# Patient Record
Sex: Male | Born: 1965 | Race: White | Hispanic: No | Marital: Married | Smoking: Former smoker
Health system: Southern US, Community
[De-identification: ages and names within clinical notes are randomized; demographics above are authoritative.]

## PROBLEM LIST (undated history)

## (undated) DIAGNOSIS — C73 Malignant neoplasm of thyroid gland: Secondary | ICD-10-CM

## (undated) DIAGNOSIS — Z87442 Personal history of urinary calculi: Secondary | ICD-10-CM

## (undated) DIAGNOSIS — N200 Calculus of kidney: Secondary | ICD-10-CM

## (undated) DIAGNOSIS — E079 Disorder of thyroid, unspecified: Secondary | ICD-10-CM

## (undated) HISTORY — DX: Disorder of thyroid, unspecified: E07.9

## (undated) HISTORY — DX: Calculus of kidney: N20.0

## (undated) HISTORY — DX: Malignant neoplasm of thyroid gland: C73

## (undated) HISTORY — PX: VASECTOMY: SHX75

## (undated) HISTORY — PX: TIBIA FRACTURE SURGERY: SHX806

---

## 2009-12-31 HISTORY — PX: LITHOTRIPSY: SUR834

## 2010-12-31 DIAGNOSIS — Z87442 Personal history of urinary calculi: Secondary | ICD-10-CM

## 2010-12-31 HISTORY — DX: Personal history of urinary calculi: Z87.442

## 2011-03-07 ENCOUNTER — Emergency Department (HOSPITAL_COMMUNITY)
Admission: EM | Admit: 2011-03-07 | Discharge: 2011-03-08 | Disposition: A | Discharge status: Home / Self Care | Payer: BC Managed Care – PPO | Source: Home / Self Care | Attending: Emergency Medicine | Admitting: Emergency Medicine

## 2011-03-07 ENCOUNTER — Emergency Department (HOSPITAL_COMMUNITY): Payer: BC Managed Care – PPO

## 2011-03-07 ENCOUNTER — Encounter (HOSPITAL_COMMUNITY): Payer: Self-pay

## 2011-03-07 DIAGNOSIS — N201 Calculus of ureter: Secondary | ICD-10-CM | POA: Insufficient documentation

## 2011-03-07 DIAGNOSIS — R109 Unspecified abdominal pain: Secondary | ICD-10-CM | POA: Insufficient documentation

## 2011-03-07 LAB — COMPREHENSIVE METABOLIC PANEL
CO2: 25 mEq/L (ref 19–32)
Calcium: 8.7 mg/dL (ref 8.4–10.5)
Creatinine, Ser: 1.05 mg/dL (ref 0.4–1.5)
GFR calc non Af Amer: >60 mL/min (ref >60)
Glucose, Bld: 127 mg/dL — ABNORMAL HIGH (ref 70–99)

## 2011-03-07 LAB — DIFFERENTIAL
Basophils Relative: 0 % (ref 0–1)
Lymphocytes Relative: 10 % — ABNORMAL LOW (ref 12–46)
Monocytes Absolute: 0.7 10*3/uL (ref 0.1–1.0)
Monocytes Relative: 5 % (ref 3–12)
Neutro Abs: 13.5 10*3/uL — ABNORMAL HIGH (ref 1.7–7.7)

## 2011-03-07 LAB — LIPASE, BLOOD: Lipase: 30 U/L (ref 11–59)

## 2011-03-07 LAB — URINE MICROSCOPIC-ADD ON

## 2011-03-07 LAB — URINALYSIS, ROUTINE W REFLEX MICROSCOPIC
Nitrite: NEGATIVE
Specific Gravity, Urine: 1.02 (ref 1.005–1.030)
Urobilinogen, UA: 1 mg/dL (ref 0.0–1.0)

## 2011-03-07 LAB — CBC
HCT: 43.1 % (ref 39.0–52.0)
Hemoglobin: 14.6 g/dL (ref 13.0–17.0)
MCH: 30.3 pg (ref 26.0–34.0)
MCHC: 33.9 g/dL (ref 30.0–36.0)

## 2011-03-07 MED ORDER — IOHEXOL 300 MG/ML  SOLN
100.0000 mL | Freq: Once | INTRAMUSCULAR | Status: AC | PRN
  Administered 2011-03-07: 100 mL via INTRAVENOUS

## 2011-03-08 ENCOUNTER — Ambulatory Visit (HOSPITAL_COMMUNITY)
Admission: RE | Admit: 2011-03-08 | Discharge: 2011-03-08 | Disposition: A | Discharge status: Home / Self Care | Payer: BC Managed Care – PPO | Source: Ambulatory Visit | Attending: Urology | Admitting: Urology

## 2011-03-08 ENCOUNTER — Ambulatory Visit (HOSPITAL_COMMUNITY)
Admission: AD | Admit: 2011-03-08 | Discharge: 2011-03-08 | Disposition: A | Discharge status: Home / Self Care | Payer: BC Managed Care – PPO | Source: Ambulatory Visit | Attending: Urology | Admitting: Urology

## 2011-03-08 DIAGNOSIS — N201 Calculus of ureter: Secondary | ICD-10-CM | POA: Insufficient documentation

## 2013-12-29 ENCOUNTER — Encounter: Payer: Self-pay | Admitting: Endocrinology

## 2013-12-29 ENCOUNTER — Ambulatory Visit (INDEPENDENT_AMBULATORY_CARE_PROVIDER_SITE_OTHER): Payer: BC Managed Care – PPO | Admitting: Endocrinology

## 2013-12-29 VITALS — BP 118/80 | HR 71 | Temp 98.3°F | Resp 12 | Ht 69.0 in | Wt 179.0 lb

## 2013-12-29 DIAGNOSIS — E041 Nontoxic single thyroid nodule: Secondary | ICD-10-CM

## 2013-12-29 NOTE — Progress Notes (Signed)
Patient ID: Cameron Perez, male   DOB: Feb 06, 1966, 47 y.o.   MRN: [id number]   Reason for Appointment: Thyroid nodule, new consultation   History of Present Illness:   The patient's thyroid nodule was first discovered in 12/14 when he had a ultrasound neck screening done at his work Apparently he had this is part of a routine health exam at work. He was seen by his PCP who has referred him here. Thyroid functions were checked and TSH was 1.1 this month.  He thinks he can feel a nodule in his neck and occasionally may have a little difficulty swallowing with needing to clear his throat   Does not feel like he has any choking sensation in her neck or pressure in any position or when lying down.     Medication List       This list is accurate as of: 12/29/13  8:53 AM.  Always use your most recent med list.               chlorhexidine 0.12 % solution  Commonly known as:  PERIDEX     CIALIS 10 MG tablet  Generic drug:  tadalafil        Allergies: No Known Allergies  Past Medical History  Diagnosis Date  . Thyroid disease     Past Surgical History  Procedure Laterality Date  . Lithotripsy  2011    Family History  Problem Relation Age of Onset  . Cancer Mother   . Cancer Maternal Grandmother     Social History:  reports that he quit smoking about 10 years ago. He has never used smokeless tobacco. His alcohol and drug histories are not on file.   Review of Systems:  CARDIOLOGY: no history of high blood pressure.                ENDOCRINOLOGY:  no history of Diabetes.             Examination:   BP 118/80  Pulse 71  Temp(Src) 98.3 F (36.8 C)  Resp 12  Ht 5\' 9"  (1.753 m)  Wt 179 lb (81.194 kg)  BMI 26.42 kg/m2  SpO2 97%   General Appearance:  averagely built and nourished, well-looking No clubbing or pallor.         Eyes: No abnormal prominence or eyelid swelling.          THYROID: He has a 1.5 cm firm nodule in the mid thyroid lobe on the right  and indistinct nodularity felt below this also. No nodule on the left side NECK: There is no lymphadenopathy .    Neurological: REFLEXES: at biceps are normal.    Assessment/Plan:  Solitary thyroid nodule on the right side based on screening ultrasound exam done Euthyroid by lab tests recently He does need a formal ultrasound to characterize the nodule and the rest of the thyroid Discussed that most likely we will need to do a needle aspiration to evaluate this and how this is done. Given patient handout on thyroid nodules He will be scheduled for this  [facility] 12/29/2013

## 2013-12-31 DIAGNOSIS — C73 Malignant neoplasm of thyroid gland: Secondary | ICD-10-CM

## 2013-12-31 HISTORY — DX: Malignant neoplasm of thyroid gland: C73

## 2014-01-01 ENCOUNTER — Telehealth: Payer: Self-pay | Admitting: *Deleted

## 2014-01-01 ENCOUNTER — Ambulatory Visit
Admission: RE | Admit: 2014-01-01 | Discharge: 2014-01-01 | Disposition: A | Discharge status: Home / Self Care | Payer: BC Managed Care – PPO | Source: Ambulatory Visit | Attending: Endocrinology | Admitting: Endocrinology

## 2014-01-01 DIAGNOSIS — E041 Nontoxic single thyroid nodule: Secondary | ICD-10-CM

## 2014-01-01 NOTE — Telephone Encounter (Signed)
Called pt and advised him that the U/S shows he has a 1.9 cm nodule, will schedule biopsy next week. Pt understood.

## 2014-01-01 NOTE — Telephone Encounter (Signed)
Shows 1.9 cm nodule, will schedule biopsy next week

## 2014-01-01 NOTE — Telephone Encounter (Signed)
Pt called requesting results from thryoid U/S. Please advise.

## 2014-01-03 ENCOUNTER — Other Ambulatory Visit: Payer: Self-pay | Admitting: Endocrinology

## 2014-01-03 DIAGNOSIS — E041 Nontoxic single thyroid nodule: Secondary | ICD-10-CM

## 2014-01-14 ENCOUNTER — Other Ambulatory Visit (HOSPITAL_COMMUNITY)
Admission: RE | Admit: 2014-01-14 | Discharge: 2014-01-14 | Disposition: A | Discharge status: Home / Self Care | Payer: BC Managed Care – PPO | Source: Ambulatory Visit | Attending: Interventional Radiology | Admitting: Interventional Radiology

## 2014-01-14 ENCOUNTER — Ambulatory Visit
Admission: RE | Admit: 2014-01-14 | Discharge: 2014-01-14 | Disposition: A | Discharge status: Home / Self Care | Payer: BC Managed Care – PPO | Source: Ambulatory Visit | Attending: Endocrinology | Admitting: Endocrinology

## 2014-01-14 DIAGNOSIS — E041 Nontoxic single thyroid nodule: Secondary | ICD-10-CM

## 2014-01-20 ENCOUNTER — Ambulatory Visit: Payer: BC Managed Care – PPO | Admitting: Endocrinology

## 2014-01-20 ENCOUNTER — Ambulatory Visit (INDEPENDENT_AMBULATORY_CARE_PROVIDER_SITE_OTHER): Payer: BC Managed Care – PPO | Admitting: Endocrinology

## 2014-01-20 DIAGNOSIS — C73 Malignant neoplasm of thyroid gland: Secondary | ICD-10-CM

## 2014-01-20 NOTE — Progress Notes (Signed)
Patient ID: Cameron Perez, male   DOB: 1966-03-26, 48 y.o.   MRN: [id number]   Reason for Appointment: Discuss biopsy results   History of Present Illness:   He had a thyroid nodule was first discovered in 12/14 when he had a ultrasound neck screening done at his work Formal ultrasound done showed a 1.9 cm right-sided nodule Needle aspiration biopsy was done on 01/14/14 and showed papillary carcinoma.  The patient is here for discussion about the diagnosis and further plans. He also has questions about the diagnosis and treatment     Medication List       This list is accurate as of: 01/20/14  9:01 PM.  Always use your most recent med list.               chlorhexidine 0.12 % solution  Commonly known as:  PERIDEX     CIALIS 10 MG tablet  Generic drug:  tadalafil        Allergies: No Known Allergies  Past Medical History  Diagnosis Date  . Thyroid disease     Past Surgical History  Procedure Laterality Date  . Lithotripsy  2011    Family History  Problem Relation Age of Onset  . Cancer Mother   . Cancer Maternal Grandmother     Social History:  reports that he quit smoking about 10 years ago. He has never used smokeless tobacco. His alcohol and drug histories are not on file.   Review of Systems:  CARDIOLOGY: no history of high blood pressure.                ENDOCRINOLOGY:  no history of Diabetes.            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 Discussed in detail the implications of the diagnosis, prognosis and treatment plan Also explained to him that although most likely since his tumor is less than 2 cm he should be at low risk for recurrence and spread. Further classification of this will be done after he has his surgery and review of pathology as well as presence of any involved lymph nodes. I indicated to him that he will need a total thyroidectomy, details of which will be explained by the surgeon He was  briefly explained the small but possible risks of parathyroid and laryngeal nerve injury during surgery  After surgery most likely he will require replacement dose of 137 mcg daily based on his weight  Also explained  the need for radioactive iodine ablation of remnant which is by recent clinical reveals usually reserved for patients at intermediate or high risk. May also be followed periodically with thyroglobulin levels   He was given the thyroid cancer handout from the [employer]  Surgery referral was done    [facility] 01/20/2014

## 2014-01-28 ENCOUNTER — Ambulatory Visit (INDEPENDENT_AMBULATORY_CARE_PROVIDER_SITE_OTHER): Payer: BC Managed Care – PPO | Admitting: General Surgery

## 2014-01-28 ENCOUNTER — Encounter (INDEPENDENT_AMBULATORY_CARE_PROVIDER_SITE_OTHER): Payer: Self-pay | Admitting: General Surgery

## 2014-01-28 VITALS — BP 110/72 | HR 76 | Temp 97.8°F | Resp 14 | Ht 70.5 in | Wt 182.0 lb

## 2014-01-28 DIAGNOSIS — C73 Malignant neoplasm of thyroid gland: Secondary | ICD-10-CM

## 2014-01-28 NOTE — Progress Notes (Signed)
Subjective:   new diagnosis of thyroid cancer  Patient ID: Cameron Perez, male   DOB: Oct 19, 1966, 48 y.o.   MRN: [id number]  HPI Patient is a very pleasant 48 year old male referred by Dr. Dwyane Dee for a new diagnosis of papillary cancer of the thyroid. The patient is a Government social research officer at [employer]. They recently offered a ultrasound screening for carotid disease which he participated in. This incidentally revealed a 1.9 cm solitary nodule in the right lobe of the thyroid. With this finding the patient was referred to Dr. Dwyane Dee and a fine-needle aspiration was ordered. I reviewed this resolved which showed an adequate specimen and was highly suspicious for papillary carcinoma. The patient had not been able to feel a lump or a pressure in his neck. He had noticed a little raspiness to his voice for a couple of months. He has no previous history of head or neck radiation. No family history of thyroid cancers.  Past Medical History  Diagnosis Date  . Thyroid disease   . Cancer     thryoid   Past Surgical History  Procedure Laterality Date  . Lithotripsy  2011  . Tibia fracture surgery      left leg   Current Outpatient Prescriptions  Medication Sig Dispense Refill  . chlorhexidine (PERIDEX) 0.12 % solution       . CIALIS 10 MG tablet       . clomiPHENE (CLOMID) 50 MG tablet       . Multiple Vitamin (MULTIVITAMIN) tablet Take 1 tablet by mouth daily.      Marland Kitchen OVER THE COUNTER MEDICATION       . thiamine (VITAMIN B-1) 100 MG tablet Take 100 mg by mouth daily.       No current facility-administered medications for this visit.   No Known Allergies History  Substance Use Topics  . Smoking status: Former Smoker    Quit date: 06/30/2003  . Smokeless tobacco: Never Used  . Alcohol Use: 0.6 oz/week    1 Cans of beer per week     Comment: weekly     Review of Systems  Constitutional: Negative.   HENT: Negative.   Respiratory: Negative.   Cardiovascular: Negative.        Objective:   Physical Exam BP 110/72  Pulse 76  Temp(Src) 97.8 F (36.6 C) (Oral)  Resp 14  Ht 5' 10.5" (1.791 m)  Wt 182 lb (82.555 kg)  BMI 25.74 kg/m2 General: Alert, well-developed male, in no distress Skin: Warm and dry without rash or infection. HEENT:with swallowing on the right there is a small palpable thyroid nodule. No other neck masses palpable. Lymph nodes: No cervical, supraclavicular nodes palpable. Lungs: Breath sounds clear and equal without increased work of breathing Cardiovascular: Regular rate and rhythm without murmur. No JVD or edema. Peripheral pulses intact. Abdomen: Nondistended. Soft and nontender. No masses palpable. No organomegaly. No palpable hernias. Extremities: No edema or joint swelling or deformity. No chronic venous stasis changes. Neurologic: Alert and fully oriented. Gait normal.    Assessment:     Recent incidental discovery of 1.9 cm solitary right thyroid nodule with fine-needle aspiration was suspicious for papillary carcinoma. I have recommended proceeding with total thyroidectomy. We discussed the procedure in detail including the indications and nature of the procedure and recovery. We discussed risks including anesthetic complications, bleeding, infection, recurrent laryngeal nerve injury with permanent hoarseness and injury to parathyroid glands with permanent hypocalcemia. We discussed postoperative radioactive iodine therapy and the need for  postoperative thyroid replacement. He was given literature regarding the procedure and all his questions were answered.     Plan:     Schedule for total thyroidectomy with overnight hospitalization

## 2014-02-01 ENCOUNTER — Ambulatory Visit: Payer: BC Managed Care – PPO | Admitting: Endocrinology

## 2014-02-02 ENCOUNTER — Encounter (INDEPENDENT_AMBULATORY_CARE_PROVIDER_SITE_OTHER): Payer: Self-pay

## 2014-02-03 ENCOUNTER — Encounter (HOSPITAL_COMMUNITY): Payer: Self-pay | Admitting: Pharmacy Technician

## 2014-02-04 NOTE — Patient Instructions (Addendum)
Diante A Hudspeth  02/04/2014                           YOUR PROCEDURE IS SCHEDULED ON:  02/09/14               PLEASE REPORT TO SHORT STAY CENTER AT :  12:15 PM               CALL THIS NUMBER IF ANY PROBLEMS THE DAY OF SURGERY :               [phone]                                REMEMBER:   Do not eat food or drink liquids AFTER MIDNIGHT   May have clear liquids UNTIL 6 HOURS BEFORE SURGERY (8:45 AM)     Take these medicines the morning of surgery with A SIP OF WATER: CLOMID   Do not wear jewelry, make-up   Do not wear lotions, powders, or perfumes.   Do not shave legs or underarms 12 hrs. before surgery (men may shave face)  Do not bring valuables to the hospital.  Contacts, dentures or bridgework may not be worn into surgery.  Leave suitcase in the car. After surgery it may be brought to your room.  For patients admitted to the hospital more than one night, checkout time is 11:00 AM                       The day of discharge.   Patients discharged the day of surgery will not be allowed to drive home.              If going home same day of surgery, must have someone stay with you first              24 hrs at home and arrange for some one to drive you home from hospital.    Special Instructions:   Please read over the following fact sheets that you were given:               1. [place]               2. DISCONTINUE ASPIRIN AND HERBAL MEDS 5 DAYS PREOP                                                X_____________________________________________________________________        Failure to follow these instructions may result in cancellation of your surgery

## 2014-02-05 ENCOUNTER — Encounter (HOSPITAL_COMMUNITY)
Admission: RE | Admit: 2014-02-05 | Discharge: 2014-02-05 | Disposition: A | Discharge status: Home / Self Care | Payer: BC Managed Care – PPO | Source: Ambulatory Visit | Attending: General Surgery | Admitting: General Surgery

## 2014-02-05 ENCOUNTER — Ambulatory Visit (HOSPITAL_COMMUNITY)
Admission: RE | Admit: 2014-02-05 | Discharge: 2014-02-05 | Disposition: A | Discharge status: Home / Self Care | Payer: BC Managed Care – PPO | Source: Ambulatory Visit | Attending: General Surgery | Admitting: General Surgery

## 2014-02-05 ENCOUNTER — Encounter (HOSPITAL_COMMUNITY): Payer: Self-pay

## 2014-02-05 DIAGNOSIS — Z01818 Encounter for other preprocedural examination: Secondary | ICD-10-CM | POA: Insufficient documentation

## 2014-02-05 DIAGNOSIS — Z01812 Encounter for preprocedural laboratory examination: Secondary | ICD-10-CM | POA: Insufficient documentation

## 2014-02-05 HISTORY — DX: Personal history of urinary calculi: Z87.442

## 2014-02-05 LAB — BASIC METABOLIC PANEL
BUN: 18 mg/dL (ref 6–23)
CO2: 29 mEq/L (ref 19–32)
Calcium: 9.2 mg/dL (ref 8.4–10.5)
Chloride: 105 mEq/L (ref 96–112)
Creatinine, Ser: 0.89 mg/dL (ref 0.50–1.35)
GFR calc Af Amer: >90 mL/min (ref >90)
GLUCOSE: 95 mg/dL (ref 70–99)
Potassium: 4.3 mEq/L (ref 3.7–5.3)
SODIUM: 143 meq/L (ref 137–147)

## 2014-02-05 LAB — CBC
HCT: 42 % (ref 39.0–52.0)
HEMOGLOBIN: 14.1 g/dL (ref 13.0–17.0)
MCH: 30.5 pg (ref 26.0–34.0)
MCHC: 33.6 g/dL (ref 30.0–36.0)
MCV: 90.7 fL (ref 78.0–100.0)
PLATELETS: 162 10*3/uL (ref 150–400)
RBC: 4.63 MIL/uL (ref 4.22–5.81)
RDW: 12.7 % (ref 11.5–15.5)
WBC: 7.5 10*3/uL (ref 4.0–10.5)

## 2014-02-08 ENCOUNTER — Ambulatory Visit (INDEPENDENT_AMBULATORY_CARE_PROVIDER_SITE_OTHER): Payer: BC Managed Care – PPO | Admitting: Surgery

## 2014-02-09 ENCOUNTER — Ambulatory Visit (HOSPITAL_COMMUNITY): Payer: BC Managed Care – PPO | Admitting: Anesthesiology

## 2014-02-09 ENCOUNTER — Encounter (HOSPITAL_COMMUNITY): Payer: BC Managed Care – PPO | Admitting: Anesthesiology

## 2014-02-09 ENCOUNTER — Observation Stay (HOSPITAL_COMMUNITY)
Admission: RE | Admit: 2014-02-09 | Discharge: 2014-02-10 | Disposition: A | Discharge status: Home / Self Care | Payer: BC Managed Care – PPO | Source: Ambulatory Visit | Attending: General Surgery | Admitting: General Surgery

## 2014-02-09 ENCOUNTER — Encounter (HOSPITAL_COMMUNITY): Payer: Self-pay

## 2014-02-09 ENCOUNTER — Encounter (HOSPITAL_COMMUNITY)
Admission: RE | Disposition: A | Discharge status: Home / Self Care | Payer: Self-pay | Source: Ambulatory Visit | Attending: General Surgery

## 2014-02-09 DIAGNOSIS — C73 Malignant neoplasm of thyroid gland: Principal | ICD-10-CM | POA: Diagnosis present

## 2014-02-09 DIAGNOSIS — Z87891 Personal history of nicotine dependence: Secondary | ICD-10-CM | POA: Insufficient documentation

## 2014-02-09 DIAGNOSIS — C770 Secondary and unspecified malignant neoplasm of lymph nodes of head, face and neck: Secondary | ICD-10-CM | POA: Insufficient documentation

## 2014-02-09 HISTORY — PX: THYROIDECTOMY: SHX17

## 2014-02-09 LAB — CALCIUM: Calcium: 8.3 mg/dL — ABNORMAL LOW (ref 8.4–10.5)

## 2014-02-09 SURGERY — THYROIDECTOMY
Anesthesia: General | Site: Neck

## 2014-02-09 MED ORDER — OXYCODONE-ACETAMINOPHEN 5-325 MG PO TABS: 1.0000 | ORAL_TABLET | ORAL | Status: DC | PRN

## 2014-02-09 MED ORDER — DEXTROSE IN LACTATED RINGERS 5 % IV SOLN
INTRAVENOUS | Status: DC
  Administered 2014-02-09: 50 mL via INTRAVENOUS

## 2014-02-09 MED ORDER — DEXAMETHASONE SODIUM PHOSPHATE 10 MG/ML IJ SOLN
INTRAMUSCULAR | Status: AC
  Filled 2014-02-09: qty 1

## 2014-02-09 MED ORDER — GLYCOPYRROLATE 0.2 MG/ML IJ SOLN
INTRAMUSCULAR | Status: AC
Start: 2014-02-09 — End: 2014-02-09
  Filled 2014-02-09: qty 3

## 2014-02-09 MED ORDER — ROCURONIUM BROMIDE 100 MG/10ML IV SOLN
INTRAVENOUS | Status: DC | PRN
  Administered 2014-02-09: 10 mg via INTRAVENOUS
  Administered 2014-02-09: 60 mg via INTRAVENOUS
  Administered 2014-02-09: 20 mg via INTRAVENOUS

## 2014-02-09 MED ORDER — PROPOFOL 10 MG/ML IV BOLUS
INTRAVENOUS | Status: DC | PRN
  Administered 2014-02-09: 200 mg via INTRAVENOUS

## 2014-02-09 MED ORDER — NEOSTIGMINE METHYLSULFATE 1 MG/ML IJ SOLN
INTRAMUSCULAR | Status: DC | PRN
  Administered 2014-02-09: 4 mg via INTRAVENOUS

## 2014-02-09 MED ORDER — CEFAZOLIN SODIUM-DEXTROSE 2-3 GM-% IV SOLR
INTRAVENOUS | Status: AC
  Filled 2014-02-09: qty 50

## 2014-02-09 MED ORDER — LEVOTHYROXINE SODIUM 150 MCG PO TABS
150.0000 ug | ORAL_TABLET | Freq: Every day | ORAL | Status: DC
  Administered 2014-02-10: 150 ug via ORAL
  Filled 2014-02-09 (×2): qty 1

## 2014-02-09 MED ORDER — HYDROMORPHONE HCL PF 1 MG/ML IJ SOLN: 0.2500 mg | INTRAMUSCULAR | Status: DC | PRN

## 2014-02-09 MED ORDER — ONDANSETRON HCL 4 MG/2ML IJ SOLN
INTRAMUSCULAR | Status: AC
  Filled 2014-02-09: qty 2

## 2014-02-09 MED ORDER — PROPOFOL 10 MG/ML IV BOLUS
INTRAVENOUS | Status: AC
Start: 2014-02-09 — End: 2014-02-09
  Filled 2014-02-09: qty 20

## 2014-02-09 MED ORDER — CEFAZOLIN SODIUM-DEXTROSE 2-3 GM-% IV SOLR
2.0000 g | INTRAVENOUS | Status: AC
  Administered 2014-02-09: 2 g via INTRAVENOUS

## 2014-02-09 MED ORDER — LIDOCAINE HCL (CARDIAC) 20 MG/ML IV SOLN
INTRAVENOUS | Status: AC
  Filled 2014-02-09: qty 5

## 2014-02-09 MED ORDER — CLOMIPHENE CITRATE 50 MG PO TABS
50.0000 mg | ORAL_TABLET | Freq: Every day | ORAL | Status: DC
  Administered 2014-02-10: 50 mg via ORAL
  Filled 2014-02-09: qty 1

## 2014-02-09 MED ORDER — LACTATED RINGERS IV SOLN
INTRAVENOUS | Status: DC
  Administered 2014-02-09: 16:00:00 via INTRAVENOUS
  Administered 2014-02-09: 1000 mL via INTRAVENOUS

## 2014-02-09 MED ORDER — ROCURONIUM BROMIDE 100 MG/10ML IV SOLN
INTRAVENOUS | Status: AC
  Filled 2014-02-09: qty 1

## 2014-02-09 MED ORDER — FENTANYL CITRATE 0.05 MG/ML IJ SOLN
INTRAMUSCULAR | Status: DC | PRN
  Administered 2014-02-09: 50 ug via INTRAVENOUS
  Administered 2014-02-09 (×2): 100 ug via INTRAVENOUS

## 2014-02-09 MED ORDER — MIDAZOLAM HCL 5 MG/5ML IJ SOLN
INTRAMUSCULAR | Status: DC | PRN
  Administered 2014-02-09: 2 mg via INTRAVENOUS

## 2014-02-09 MED ORDER — ONDANSETRON HCL 4 MG PO TABS: 4.0000 mg | ORAL_TABLET | Freq: Four times a day (QID) | ORAL | Status: DC | PRN

## 2014-02-09 MED ORDER — 0.9 % SODIUM CHLORIDE (POUR BTL) OPTIME
TOPICAL | Status: DC | PRN
  Administered 2014-02-09: 1000 mL

## 2014-02-09 MED ORDER — HYDROMORPHONE HCL PF 1 MG/ML IJ SOLN
INTRAMUSCULAR | Status: DC | PRN
  Administered 2014-02-09 (×4): 0.5 mg via INTRAVENOUS

## 2014-02-09 MED ORDER — BUPIVACAINE-EPINEPHRINE 0.25% -1:200000 IJ SOLN
INTRAMUSCULAR | Status: AC
  Filled 2014-02-09: qty 1

## 2014-02-09 MED ORDER — FENTANYL CITRATE 0.05 MG/ML IJ SOLN
INTRAMUSCULAR | Status: AC
  Filled 2014-02-09: qty 5

## 2014-02-09 MED ORDER — MORPHINE SULFATE 2 MG/ML IJ SOLN
2.0000 mg | INTRAMUSCULAR | Status: DC | PRN
  Administered 2014-02-09 – 2014-02-10 (×3): 4 mg via INTRAVENOUS
  Filled 2014-02-09 (×3): qty 2

## 2014-02-09 MED ORDER — LIDOCAINE HCL (PF) 2 % IJ SOLN
INTRAMUSCULAR | Status: DC | PRN
  Administered 2014-02-09: 75 mg via INTRADERMAL

## 2014-02-09 MED ORDER — ONDANSETRON HCL 4 MG/2ML IJ SOLN
INTRAMUSCULAR | Status: DC | PRN
  Administered 2014-02-09: 4 mg via INTRAVENOUS

## 2014-02-09 MED ORDER — PROMETHAZINE HCL 25 MG/ML IJ SOLN: 6.2500 mg | INTRAMUSCULAR | Status: DC | PRN

## 2014-02-09 MED ORDER — ONDANSETRON HCL 4 MG/2ML IJ SOLN
4.0000 mg | Freq: Four times a day (QID) | INTRAMUSCULAR | Status: DC | PRN
Start: 2014-02-09 — End: 2014-02-10
  Administered 2014-02-09: 4 mg via INTRAVENOUS
  Filled 2014-02-09: qty 2

## 2014-02-09 MED ORDER — DEXAMETHASONE SODIUM PHOSPHATE 10 MG/ML IJ SOLN
INTRAMUSCULAR | Status: DC | PRN
  Administered 2014-02-09: 10 mg via INTRAVENOUS

## 2014-02-09 MED ORDER — GLYCOPYRROLATE 0.2 MG/ML IJ SOLN
INTRAMUSCULAR | Status: DC | PRN
  Administered 2014-02-09: 0.6 mg via INTRAVENOUS

## 2014-02-09 MED ORDER — BUPIVACAINE HCL (PF) 0.25 % IJ SOLN
INTRAMUSCULAR | Status: AC
  Filled 2014-02-09: qty 30

## 2014-02-09 MED ORDER — MIDAZOLAM HCL 2 MG/2ML IJ SOLN
INTRAMUSCULAR | Status: AC
  Filled 2014-02-09: qty 2

## 2014-02-09 MED ORDER — HYDROMORPHONE HCL PF 2 MG/ML IJ SOLN
INTRAMUSCULAR | Status: AC
  Filled 2014-02-09: qty 1

## 2014-02-09 SURGICAL SUPPLY — 42 items
ATTRACTOMAT 16X20 MAGNETIC DRP (DRAPES) ×3 IMPLANT
BENZOIN TINCTURE PRP APPL 2/3 (GAUZE/BANDAGES/DRESSINGS) ×3 IMPLANT
BLADE HEX COATED 2.75 (ELECTRODE) ×3 IMPLANT
BLADE SURG 15 STRL LF DISP TIS (BLADE) ×1 IMPLANT
BLADE SURG 15 STRL SS (BLADE) ×2
BLADE SURG SZ10 CARB STEEL (BLADE) ×3 IMPLANT
CANISTER SUCTION 2500CC (MISCELLANEOUS) IMPLANT
CLIP TI MEDIUM 6 (CLIP) ×9 IMPLANT
CLIP TI WIDE RED SMALL 6 (CLIP) ×9 IMPLANT
CLOSURE WOUND 1/2 X4 (GAUZE/BANDAGES/DRESSINGS) ×1
DISSECTOR ROUND CHERRY 3/8 STR (MISCELLANEOUS) ×6 IMPLANT
DRAPE PED LAPAROTOMY (DRAPES) ×3 IMPLANT
DRESSING SURGICEL FIBRLLR 1X2 (HEMOSTASIS) IMPLANT
DRSG SURGICEL FIBRILLAR 1X2 (HEMOSTASIS)
ELECT COATED BLADE 2.86 ST (ELECTRODE) ×3 IMPLANT
ELECT REM PT RETURN 9FT ADLT (ELECTROSURGICAL) ×3
ELECTRODE REM PT RTRN 9FT ADLT (ELECTROSURGICAL) ×1 IMPLANT
GAUZE SPONGE 4X4 16PLY XRAY LF (GAUZE/BANDAGES/DRESSINGS) ×3 IMPLANT
GOWN STRL REUS W/TWL LRG LVL3 (GOWN DISPOSABLE) IMPLANT
GOWN STRL REUS W/TWL XL LVL3 (GOWN DISPOSABLE) ×9 IMPLANT
HEMOSTAT SURGICEL 2X14 (HEMOSTASIS) IMPLANT
KIT BASIN OR (CUSTOM PROCEDURE TRAY) ×3 IMPLANT
MANIFOLD NEPTUNE II (INSTRUMENTS) ×3 IMPLANT
MARKER SKIN DUAL TIP RULER LAB (MISCELLANEOUS) ×3 IMPLANT
NS IRRIG 1000ML POUR BTL (IV SOLUTION) ×3 IMPLANT
PACK BASIC VI WITH GOWN DISP (CUSTOM PROCEDURE TRAY) ×3 IMPLANT
PENCIL BUTTON HOLSTER BLD 10FT (ELECTRODE) IMPLANT
SHEARS FOC LG CVD HARMONIC 17C (MISCELLANEOUS) IMPLANT
SHEARS HARMONIC 9CM CVD (BLADE) ×3 IMPLANT
SPONGE GAUZE 4X4 12PLY (GAUZE/BANDAGES/DRESSINGS) ×3 IMPLANT
STAPLER VISISTAT 35W (STAPLE) ×3 IMPLANT
STRIP CLOSURE SKIN 1/2X4 (GAUZE/BANDAGES/DRESSINGS) ×2 IMPLANT
SUT SILK 2 0 (SUTURE) ×2
SUT SILK 2-0 18XBRD TIE 12 (SUTURE) ×1 IMPLANT
SUT SILK 3 0 (SUTURE)
SUT SILK 3-0 18XBRD TIE 12 (SUTURE) IMPLANT
SUT VIC AB 3-0 SH 18 (SUTURE) ×3 IMPLANT
SUT VICRYL 2 0 18  UND BR (SUTURE)
SUT VICRYL 2 0 18 UND BR (SUTURE) IMPLANT
SYR BULB IRRIGATION 50ML (SYRINGE) ×3 IMPLANT
TOWEL OR 17X26 10 PK STRL BLUE (TOWEL DISPOSABLE) ×3 IMPLANT
YANKAUER SUCT BULB TIP 10FT TU (MISCELLANEOUS) ×3 IMPLANT

## 2014-02-09 NOTE — Interval H&P Note (Signed)
History and Physical Interval Note:  02/09/2014 3:05 PM  Cameron Perez  has presented today for surgery, with the diagnosis of thyroid cancer   The various methods of treatment have been discussed with the patient and family. After consideration of risks, benefits and other options for treatment, the patient has consented to  Procedure(s): THYROIDECTOMY (N/A) as a surgical intervention .  The patient's history has been reviewed, patient examined, no change in status, stable for surgery.  I have reviewed the patient's chart and labs.  Questions were answered to the patient's satisfaction.     Cameron Perez T

## 2014-02-09 NOTE — Transfer of Care (Signed)
Immediate Anesthesia Transfer of Care Note  Patient: Cameron Perez  Procedure(s) Performed: Procedure(s): THYROIDECTOMY (N/A)  Patient Location: PACU  Anesthesia Type:General  Level of Consciousness: awake, alert , oriented and patient cooperative  Airway & Oxygen Therapy: Patient Spontanous Breathing and Patient connected to face mask oxygen  Post-op Assessment: Report given to PACU RN and Post -op Vital signs reviewed and stable  Post vital signs: Reviewed and stable  Complications: No apparent anesthesia complications

## 2014-02-09 NOTE — Anesthesia Postprocedure Evaluation (Signed)
  Anesthesia Post-op Note  Patient: Cameron Perez  Procedure(s) Performed: Procedure(s) (LRB): THYROIDECTOMY (N/A)  Patient Location: PACU  Anesthesia Type: General  Level of Consciousness: awake and alert   Airway and Oxygen Therapy: Patient Spontanous Breathing  Post-op Pain: mild  Post-op Assessment: Post-op Vital signs reviewed, Patient's Cardiovascular Status Stable, Respiratory Function Stable, Patent Airway and No signs of Nausea or vomiting  Last Vitals:  Filed Vitals:   02/09/14 1843  BP: 162/85  Pulse: 84  Temp: 36.6 C  Resp: 20    Post-op Vital Signs: stable   Complications: No apparent anesthesia complications

## 2014-02-09 NOTE — H&P (View-Only) (Signed)
Subjective:   new diagnosis of thyroid cancer  Patient ID: Cameron Perez, male   DOB: 09/15/1966, 48 y.o.   MRN: [id number]  HPI Patient is a very pleasant 48-year-old male referred by Dr. Kumar for a new diagnosis of papillary cancer of the thyroid. 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 They recently offered a ultrasound screening for carotid disease which he participated in. This incidentally revealed a 1.9 cm solitary nodule in the right lobe of the thyroid. With this finding the patient was referred to Dr. Kumar and a fine-needle aspiration was ordered. I reviewed this resolved which showed an adequate specimen and was highly suspicious for papillary carcinoma. The patient had not been able to feel a lump or a pressure in his neck. He had noticed a little raspiness to his voice for a couple of months. He has no previous history of head or neck radiation. No family history of thyroid cancers.  Past Medical History  Diagnosis Date  . Thyroid disease   . Cancer     thryoid   Past Surgical History  Procedure Laterality Date  . Lithotripsy  2011  . Tibia fracture surgery      left leg   Current Outpatient Prescriptions  Medication Sig Dispense Refill  . chlorhexidine (PERIDEX) 0.12 % solution       . CIALIS 10 MG tablet       . clomiPHENE (CLOMID) 50 MG tablet       . Multiple Vitamin (MULTIVITAMIN) tablet Take 1 tablet by mouth daily.      . OVER THE COUNTER MEDICATION       . thiamine (VITAMIN B-1) 100 MG tablet Take 100 mg by mouth daily.       No current facility-administered medications for this visit.   No Known Allergies History  Substance Use Topics  . Smoking status: Former Smoker    Quit date: 06/30/2003  . Smokeless tobacco: Never Used  . Alcohol Use: 0.6 oz/week    1 Cans of beer per week     Comment: weekly     Review of Systems  Constitutional: Negative.   HENT: Negative.   Respiratory: Negative.   Cardiovascular: Negative.        Objective:   Physical Exam BP 110/72  Pulse 76  Temp(Src) 97.8 F (36.6 C) (Oral)  Resp 14  Ht 5' 10.5" (1.791 m)  Wt 182 lb (82.555 kg)  BMI 25.74 kg/m2 General: Alert, well-developed male, in no distress Skin: Warm and dry without rash or infection. HEENT:with swallowing on the right there is a small palpable thyroid nodule. No other neck masses palpable. Lymph nodes: No cervical, supraclavicular nodes palpable. Lungs: Breath sounds clear and equal without increased work of breathing Cardiovascular: Regular rate and rhythm without murmur. No JVD or edema. Peripheral pulses intact. Abdomen: Nondistended. Soft and nontender. No masses palpable. No organomegaly. No palpable hernias. Extremities: No edema or joint swelling or deformity. No chronic venous stasis changes. Neurologic: Alert and fully oriented. Gait normal.    Assessment:     Recent incidental discovery of 1.9 cm solitary right thyroid nodule with fine-needle aspiration was suspicious for papillary carcinoma. I have recommended proceeding with total thyroidectomy. We discussed the procedure in detail including the indications and nature of the procedure and recovery. We discussed risks including anesthetic complications, bleeding, infection, recurrent laryngeal nerve injury with permanent hoarseness and injury to parathyroid glands with permanent hypocalcemia. We discussed postoperative radioactive iodine therapy and the need for   postoperative thyroid replacement. He was given literature regarding the procedure and all his questions were answered.     Plan:     Schedule for total thyroidectomy with overnight hospitalization

## 2014-02-09 NOTE — Anesthesia Preprocedure Evaluation (Signed)
Anesthesia Evaluation  Patient identified by MRN, date of birth, ID band Patient awake    Reviewed: Allergy & Precautions, H&P , NPO status , Patient's Chart, lab work & pertinent test results  Airway Mallampati: II TM Distance: >3 FB Neck ROM: Full    Dental no notable dental hx.    Pulmonary former smoker,  breath sounds clear to auscultation  Pulmonary exam normal       Cardiovascular negative cardio ROS  Rhythm:Regular Rate:Normal     Neuro/Psych negative neurological ROS  negative psych ROS   GI/Hepatic negative GI ROS, Neg liver ROS,   Endo/Other  negative endocrine ROS  Renal/GU negative Renal ROS  negative genitourinary   Musculoskeletal negative musculoskeletal ROS (+)   Abdominal   Peds negative pediatric ROS (+)  Hematology negative hematology ROS (+)   Anesthesia Other Findings   Reproductive/Obstetrics negative OB ROS                           Anesthesia Physical Anesthesia Plan  ASA: II  Anesthesia Plan: General   Post-op Pain Management:    Induction: Intravenous  Airway Management Planned: Oral ETT  Additional Equipment:   Intra-op Plan:   Post-operative Plan: Extubation in OR  Informed Consent: I have reviewed the patients History and Physical, chart, labs and discussed the procedure including the risks, benefits and alternatives for the proposed anesthesia with the patient or authorized representative who has indicated his/her understanding and acceptance.   Dental advisory given  Plan Discussed with: CRNA  Anesthesia Plan Comments:         Anesthesia Quick Evaluation  

## 2014-02-09 NOTE — Op Note (Signed)
Preoperative Diagnosis: thyroid cancer   Postoprative Diagnosis: thyroid cancer   Procedure: Procedure(s): TOTAL THYROIDECTOMY WITH CENTRAL (ZONE 6) LYMPH NODE DISSECTION   Surgeon: Excell Seltzer T   Assistants: Armandina Gemma  Anesthesia:  General endotracheal anesthesia  Indications: patient is a generally healthy 48 year old male who on a recent ultrasound vascular screening of his neck was found to have a right thyroid nodule. Subsequent thyroid ultrasound revealed a solid 1.9 cm nodule and fine-needle aspiration has revealed papillary carcinoma. We have recommended proceeding with total thyroidectomy with central lymph node dissection. Procedure and its indications and risks have been discussed extensively with the patient in detail elsewhere. He is now brought to the operating room for this procedure.  Procedure Detail:  Patient was brought to the operating room, placed in the supine position on the operating table and general endotracheal anesthesia induced. He was carefully positioned and padded with his neck extended and the entire neck and upper chest were widely sterilely prepped and draped. Patient timeout was performed and correct procedure verified. A curvilinear incision was made in a skin crease between the sternal notch and the thyroid cartilage. Dissection was carried down through the subcutaneous tissue and platysma with cautery. Subplatysmal flaps were then raised superiorly the thyroid cartilage, inferiorly to the sternal notch and laterally out to the sternocleidomastoid. The flaps were retracted and the strap muscles were divided in the midline. Dissection was carried down onto the gland surface. The left side was initially dissected. Blunt and cautery dissection was used to expose the entire anterior left lobe. With careful blunt dissection the gland was mobilized out of the lateral neck. The middle thyroid vein was identified and dissected and clipped proximally and divided  with the harmonic scalpel. With mostly blunt dissection the superior pole was mobilized and isolated down to the superior pole vessels. These were encircled with careful blunt dissection just above the upper pole of the thyroid and then controlled with proximal clip and divided with the harmonic scalpel and further tissue just posterior to this divided with harmonic scalpel toward the upper pole was completely freed. At this point the gland could be mobilized medially and careful dissection on the gland was carried down dividing individual branches of the inferior thyroid artery. As the dissection progressed dissecting in the tracheoesophageal groove we identified the recurrent laryngeal nerve on the left side and its path up toward the cricopharyngeus was clearly identified and it was kept in view and protected throughout the remainder of the dissection. We stayed right on the gland capsule and mobilized individual vessels controlled with small clips and harmonic scalpel until the gland was mobilized up onto the ligament of Gwenlyn Found which was divided with the cautery and this was mobilized to the midline. The left lobe was normal without mass. Following this an identical dissection was performed on the right. There was a very firm 2 cm nodule in the upper lobe on the right with no evidence of any invasion into surrounding soft tissue. On this side we also had very good visualization of the recurrent laryngeal nerve which was protected throughout the dissection an identical fashion. Finally the gland was mobilized up off the trachea and removed. It was oriented with sutures and sent for permanent section. We did identify grossly the inferior parathyroid glands on either side during the dissection and they were protected and saw what was likely the superior parathyroid gland on the right side near the upper pole as well. Following this a zone 6 Central  lymph node dissection was performed. There were no abnormal nodes  palpable centrally laterally.  Beginning just anterior medial to the carotid in the mid neck the lymph node packet was teased medially on either side and carefully dissected with cautery staying anterior to the course of the recurrent laryngeal nerve on either side. The dissection was carried down toward the sternum and all tissue anterior to the trachea and out laterally toward the carotid sheath was mobilized working inferiorly. Dissection continued inferiorly we identified the superior portion of the thymus gland which we took with the lymph nodes coming across this with cautery and clips and the specimen removed. This was sent as a separate permanent section. The neck was irrigated there was no evidence of bleeding. Fibrilar was placed in the thyroid bed and behind the sternum. The strap muscles were closed in the midline with interrupted 3-0 Vicryl. The platysma was closed with interrupted 3-0 Vicryl. The skin was closed with widely spaced staples alternating with half-inch Steri-Strips and benzoin. A pressure dressing was applied. Sponge needle instrument count was correct.    Findings: As above  Estimated Blood Loss:  Minimal         Drains: nnone  Blood Given: none          Specimens: #1 total thyroidectomy #2 Central (zone 6) lymph nodes        Complications:  * No complications entered in OR log *         Disposition: PACU - hemodynamically stable.         Condition: stable

## 2014-02-09 NOTE — Progress Notes (Signed)
Patient received from PACU, drowsy but easily aroused.  PACU staff assisted patient to bathroom, voided 600cc clear urine, became pale, dizziness and nauseated.  Assisted to bed, vitals obtained/charted, oxygen at 2L via nasal cannula initiated.  IV present in left inner forearm, infusing without issue.  Anterior neck dressing clean dry and intact, ice pack in place, SCD's on bilateral legs.  Patient given sprite to sip on.

## 2014-02-10 ENCOUNTER — Encounter (HOSPITAL_COMMUNITY): Payer: Self-pay | Admitting: General Surgery

## 2014-02-10 LAB — CALCIUM
Calcium: 7.6 mg/dL — ABNORMAL LOW (ref 8.4–10.5)
Calcium: 9.1 mg/dL (ref 8.4–10.5)

## 2014-02-10 MED ORDER — OXYCODONE-ACETAMINOPHEN 5-325 MG PO TABS: 1.0000 | ORAL_TABLET | ORAL | Status: DC | PRN

## 2014-02-10 MED ORDER — CALCIUM CARBONATE-VITAMIN D 500-200 MG-UNIT PO TABS
3.0000 | ORAL_TABLET | Freq: Three times a day (TID) | ORAL | Status: DC
  Administered 2014-02-10 (×2): 3 via ORAL
  Filled 2014-02-10 (×4): qty 3

## 2014-02-10 MED ORDER — LEVOTHYROXINE SODIUM 150 MCG PO TABS: 150.0000 ug | ORAL_TABLET | Freq: Every day | ORAL | Status: DC

## 2014-02-10 MED ORDER — SODIUM CHLORIDE 0.9 % IV SOLN
2.0000 g | Freq: Once | INTRAVENOUS | Status: AC
  Administered 2014-02-10: 2 g via INTRAVENOUS
  Filled 2014-02-10: qty 20

## 2014-02-10 MED ORDER — CALCIUM CARBONATE-VITAMIN D 500-200 MG-UNIT PO TABS: 3.0000 | ORAL_TABLET | Freq: Three times a day (TID) | ORAL | Status: DC

## 2014-02-10 NOTE — Discharge Instructions (Signed)
[facility]      [facility] °[phone] ° °THYROID/ PARATHYROID SURGERY: POST OP INSTRUCTIONS ° °Always review your discharge instruction sheet given to you by the facility where your surgery was performed. ° °IF YOU HAVE DISABILITY OR FAMILY LEAVE FORMS, YOU MUST BRING THEM TO THE OFFICE FOR PROCESSING.  PLEASE DO NOT GIVE THEM TO YOUR DOCTOR. ° °1. A prescription for pain medication may be given to you upon discharge.  Take your pain medication as prescribed, if needed.  If narcotic pain medicine is not needed, then you may take acetaminophen (Tylenol) or ibuprofen (Advil) as needed. °2. Take your usually prescribed medications unless otherwise directed. °3. If you need a refill on your pain medication, please contact your pharmacy. They will contact our office to request authorization.  Prescriptions will not be filled after 5pm or on week-ends. °4. You should follow a light diet the first 24 hours after arrival home, such as soup and crackers, etc.  Be sure to include lots of fluids daily.  Resume your normal diet the day after surgery. °5. Most patients will experience some swelling and bruising on the chest and neck area.  Ice packs will help.  Swelling and bruising can take several days to resolve.  °6. It is common to experience some constipation if taking pain medication after surgery.  Increasing fluid intake and taking a stool softener will usually help or prevent this problem from occurring.  A mild laxative (Milk of Magnesia or Miralax) should be taken according to package directions if there are no bowel movements after 48 hours. °7. Unless discharge instructions indicate otherwise, you may remove your bandages 24-48 hours after surgery, and you may shower at that time.  You may have steri-strips (small skin tapes) in place directly over the incision.  These strips should be left on the skin for 7-10 days.  If your surgeon used skin glue on the incision, you may shower in 24 hours.  The  glue will flake off over the next 2-3 weeks.  Any sutures or staples will be removed at the office during your follow-up visit. °8. ACTIVITIES:  You may resume regular (light) daily activities beginning the next day--such as daily self-care, walking, climbing stairs--gradually increasing activities as tolerated.  You may have sexual intercourse when it is comfortable.  Refrain from any heavy lifting or straining until approved by your doctor. °a. You may drive when you no longer are taking prescription pain medication, you can comfortably wear a seatbelt, and you can safely maneuver your car and apply brakes °b. RETURN TO WORK:  __________________________________________________________ °9. You should see your doctor in the office for a follow-up appointment approximately two weeks after your surgery.  Make sure that you call for this appointment within a day or two after you arrive home to insure a convenient appointment time. °10. OTHER INSTRUCTIONS: ____________________________________________________________________________ _________________________________________________________________________________________________________________ °_________________________________________________________________________________________________________________ ° ° °WHEN TO CALL YOUR DOCTOR: °1. Fever over 101.0 °2. Inability to urinate °3. Nausea and/or vomiting °4. Extreme swelling or bruising °5. Continued bleeding from incision. °6. Increased pain, redness, or drainage from the incision. °7. Difficulty swallowing or breathing °8. Muscle cramping or spasms. °9. Numbness or tingling in hands or feet or around lips. ° °The clinic staff is available to answer your questions during regular business hours.  Please don’t hesitate to call and ask to speak to one of the nurses if you have concerns. ° °For further questions, please visit [email or web address] °

## 2014-02-10 NOTE — Progress Notes (Signed)
Patient ID: Cameron Perez, male   DOB: 06/23/1966, 48 y.o.   MRN: [id number] 1 Day Post-Op  Subjective: No complaints this morning. Just mild sore throat.  Objective: Vital signs in last 24 hours: Temp:  [97.4 F (36.3 C)-98.6 F (37 C)] 98 F (36.7 C) (02/11 8527) Pulse Rate:  [72-94] 80 (02/11 0608) Resp:  [14-20] 18 (02/11 0608) BP: (138-184)/(82-95) 143/85 mmHg (02/11 0608) SpO2:  [93 %-100 %] 94 % (02/11 0608) Last BM Date: 02/09/14  Intake/Output from previous day: 02/10 0701 - 02/11 0700 In: 1400 [I.V.:1400] Out: 1125 [Urine:1100; Blood:25] Intake/Output this shift:    General appearance: alert, cooperative, no distress and voice is normal Incision/Wound:clean and dry without swelling.  Lab Results:  No results found for this basename: WBC, HGB, HCT, PLT,  in the last 72 hours BMET  Recent Labs  02/09/14 1853 02/10/14 0647  CALCIUM 8.3* 7.6*     Studies/Results: No results found.  Anti-infectives: Anti-infectives   Start     Dose/Rate Route Frequency Ordered Stop   02/09/14 1138  ceFAZolin (ANCEF) IVPB 2 g/50 mL premix     2 g 100 mL/hr over 30 Minutes Intravenous On call to O.R. 02/09/14 1138 02/09/14 1522      Assessment/Plan: s/p Procedure(s): THYROIDECTOMY Doing well postoperatively. Calcium has dropped somewhat. We'll start oral calcium and repeat later this afternoon. Hold discharge for now until calcium is stable.   LOS: 1 day    Irine Heminger T 02/10/2014

## 2014-02-10 NOTE — Progress Notes (Signed)
Patient discharge home, alert and oriented with wife, discharge instructions given, patient verbalize understanding of discharge instructions given, active member of My Chart, patient in stable condition at this time

## 2014-02-10 NOTE — Progress Notes (Signed)
Mr Cameron Perez had a good night post thyroidectomy,  was up To the bathroom with one assist,  eceived intermittent pain Meds for throat pain. Denies any other needs at this time. Will continue to monitor.

## 2014-02-11 NOTE — Discharge Summary (Signed)
  Patient ID: Cameron Perez [id number] 47 y.o. 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 He is electively admitted for total thyroidectomy and central neck lymph node dissection.  Hospital Course: on the day admission the patient underwent an uneventful total thyroidectomy with central lymph node dissection. He tolerated the procedure well. On the first postoperative day his voice was normal and the wound was healing well without hematoma. His calcium had dropped to 7.6 and he received 1 dose of IV calcium and was started on oral calcium. Followup calcium level later that day was 9.1 and he was felt stable for discharge home. He has specific instructions of symptoms to watch for for hypocalcemia and to call immediately should he experience any of these.   Disposition: Home  Patient Instructions:    Medication List         B-complex with vitamin C tablet  Take 1 tablet by mouth daily.     CAL-MAG-ZINC PO  Take 1 tablet by mouth daily.     calcium-vitamin D 500-200 MG-UNIT per tablet  Commonly known as:  OSCAL WITH D  Take 3 tablets by mouth 3 (three) times daily.     Chromium 500 MCG Tabs  Take 1 tablet by mouth daily.     CIALIS 10 MG tablet  Generic drug:  tadalafil  Take 10 mg by mouth daily as needed for erectile dysfunction.     clomiPHENE 50 MG tablet  Commonly known as:  CLOMID  Take 50 mg by mouth daily.     L-Arginine 500 MG Caps  Take 1 capsule by mouth daily.     levothyroxine 150 MCG tablet  Commonly known  as:  SYNTHROID, LEVOTHROID  Take 1 tablet (150 mcg total) by mouth daily before breakfast.     multivitamin tablet  Take 1 tablet by mouth daily.     omega-3 acid ethyl esters 1 G capsule  Commonly known as:  LOVAZA  Take 1 g by mouth daily.     OVER THE COUNTER MEDICATION  Take 2 tablets by mouth daily. andro 400     oxyCODONE-acetaminophen 5-325 MG per tablet  Commonly known as:  PERCOCET/ROXICET  Take 1-2 tablets by mouth every 4 (four) hours as needed for moderate pain.        Activity: activity as tolerated Diet: regular diet Wound Care: none needed  Follow-up:  With Dr. Excell Seltzer in 2 weeks.  Signed: [name] MD, FACS  02/11/2014, 1:10 PM

## 2014-02-25 ENCOUNTER — Encounter (INDEPENDENT_AMBULATORY_CARE_PROVIDER_SITE_OTHER): Payer: BC Managed Care – PPO | Admitting: General Surgery

## 2014-02-26 ENCOUNTER — Encounter (INDEPENDENT_AMBULATORY_CARE_PROVIDER_SITE_OTHER): Payer: Self-pay | Admitting: General Surgery

## 2014-02-26 ENCOUNTER — Ambulatory Visit (INDEPENDENT_AMBULATORY_CARE_PROVIDER_SITE_OTHER): Payer: BC Managed Care – PPO | Admitting: General Surgery

## 2014-02-26 VITALS — BP 130/90 | HR 76 | Temp 98.1°F | Resp 16 | Ht 70.0 in | Wt 181.2 lb

## 2014-02-26 DIAGNOSIS — C73 Malignant neoplasm of thyroid gland: Secondary | ICD-10-CM

## 2014-02-26 NOTE — Progress Notes (Signed)
History: Patient returns for followup after total thyroidectomy and central lymph node dissection for papillary cancer of the thyroid. He reports he is doing well. No symptoms of hypo-or hyper-thyroidism on his current dose of Synthroid. No symptoms of hypocalcemia. Voice is normal.  Exam: He appears well. Neck incision is well-healed without swelling or other complication. Voice is normal.  We reviewed his pathology:  1. Thyroid, thyroidectomy - PAPILLARY THYROID CARCINOMA, TWO FOCI, 1.7 CM RIGHT LOBE AND 0.3 CM LEFT LOBE. - MARGINS NOT INVOLVED. - FOCAL EXTRATHYROID EXTENSION. 2. 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 - BENIGN THYMUS TISSUE. - BENIGN PARATHYROID TISSUE.  Assessment and plan: Doing well following total thyroidectomy and lymph node dissection without complication identified. All his questions regarding the pathology report were discussed. He will need I-131 treatment and will be returning to see Dr. Dwyane Dee to have this arranged. I told him to continue his calcium supplements for 4 weeks after surgery and then taper them off over 3 weeks. We discussed followup regimens which for now I will leave to Dr. Dwyane Dee but I told him I would be happy to see him back for routine followup at any point if he or Dr. Dwyane Dee would like me to.

## 2014-03-01 ENCOUNTER — Encounter: Payer: Self-pay | Admitting: Endocrinology

## 2014-03-03 ENCOUNTER — Other Ambulatory Visit (INDEPENDENT_AMBULATORY_CARE_PROVIDER_SITE_OTHER): Payer: BC Managed Care – PPO

## 2014-03-03 DIAGNOSIS — C73 Malignant neoplasm of thyroid gland: Secondary | ICD-10-CM

## 2014-03-03 LAB — T4, FREE: FREE T4: 1.63 ng/dL — AB (ref 0.60–1.60)

## 2014-03-03 LAB — TSH: TSH: 0.68 u[IU]/mL (ref 0.35–5.50)

## 2014-03-08 ENCOUNTER — Other Ambulatory Visit: Payer: Self-pay | Admitting: Endocrinology

## 2014-03-08 ENCOUNTER — Encounter: Payer: Self-pay | Admitting: Endocrinology

## 2014-03-08 ENCOUNTER — Ambulatory Visit (INDEPENDENT_AMBULATORY_CARE_PROVIDER_SITE_OTHER): Payer: BC Managed Care – PPO | Admitting: Endocrinology

## 2014-03-08 VITALS — BP 142/88 | HR 78 | Temp 98.2°F | Resp 16 | Ht 70.5 in | Wt 185.0 lb

## 2014-03-08 DIAGNOSIS — C73 Malignant neoplasm of thyroid gland: Secondary | ICD-10-CM

## 2014-03-08 NOTE — Progress Notes (Signed)
Patient ID: Cameron Perez, male   DOB: March 13, 1966, 48 y.o.   MRN: [id number]   Reason for Appointment: Discuss results of surgery   History of Present Illness:   He had a thyroid nodule which was first discovered in 12/14 when he had a ultrasound neck screening done at his work Ultrasound dictated a 1.9 cm right-sided nodule and this was papillary carcinoma on needle biopsy in 1/15  Surgical pathology showed the following: PAPILLARY THYROID CARCINOMA, TWO FOCI, 1.7 CM RIGHT LOBE AND 0.3 CM LEFT LOBE. - MARGINS NOT INVOLVED. - FOCAL EXTRATHYROID EXTENSION. Margins: Free of tumor. Lymph - Vascular invasion: Present. 1 positive lymph node  The patient is here for discussion about pathology outcome and further plans. He also has questions about the diagnosis and treatment He has had good outcome of the surgery and no local discomfort, no hoarseness and no tingling or numbness in his hands or feet  Lab Results  Component Value Date   TSH 0.68 03/03/2014       Medication List       This list is accurate as of: 03/08/14  9:18 AM.  Always use your most recent med list.               B-complex with vitamin C tablet  Take 1 tablet by mouth daily.     CAL-MAG-ZINC PO  Take 1 tablet by mouth daily.     calcium-vitamin D 500-200 MG-UNIT per tablet  Commonly known as:  OSCAL WITH D  Take 3 tablets by mouth 3 (three) times daily.     Chromium 500 MCG Tabs  Take 1 tablet by mouth daily.     CIALIS 10 MG tablet  Generic drug:  tadalafil  Take 10 mg by mouth daily as needed for erectile dysfunction.     clomiPHENE 50 MG tablet  Commonly known as:  CLOMID  Take 50 mg by mouth daily.     L-Arginine 500 MG Caps  Take 1 capsule by mouth daily.     levothyroxine 150 MCG tablet  Commonly known as:  SYNTHROID, LEVOTHROID  Take 1 tablet (150 mcg total) by mouth daily before breakfast.     multivitamin tablet  Take 1 tablet by mouth daily.     omega-3 acid ethyl esters 1 G  capsule  Commonly known as:  LOVAZA  Take 1 g by mouth daily.     OVER THE COUNTER MEDICATION  Take 2 tablets by mouth daily. andro 400        Allergies: No Known Allergies  Past Medical History  Diagnosis Date  . Thyroid disease   . Cancer     thryoid  . History of kidney stones     Past Surgical History  Procedure Laterality Date  . Lithotripsy  2011  . Tibia fracture surgery      left leg  . Thyroidectomy N/A 02/09/2014    Procedure: THYROIDECTOMY;  Surgeon: [name], MD;  Location: [facility];  Service: General;  Laterality: N/A;    Family History  Problem Relation Age of Onset  . Cancer Mother     breast  . Cancer Maternal Grandmother     breast    Social History:  reports that he quit smoking about 10 years ago. He has never used smokeless tobacco. He reports that he drinks about 0.6 ounces of alcohol per week. He reports that he does not use illicit drugs.   Review of Systems:    no  history of Diabetes.            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 He does have local spread to one lymph node on the pathology and also focal extrathyroidal extension although margins are free of tumor  He will be treated with 75 mCi of I-131 which should also cause remnant ablation Discussed the process of doing the I-131 treatment including low iodine diet, use of Thyrogen and doing a posttreatment scan Brochure given on Thyrogen stimulated I-131 treatment  Since his TSH is normal and below 1.0 he will continue on the same dose of 137 mcg supplement  [facility] 03/08/2014

## 2014-03-09 ENCOUNTER — Other Ambulatory Visit: Payer: Self-pay | Admitting: Endocrinology

## 2014-03-09 DIAGNOSIS — C73 Malignant neoplasm of thyroid gland: Secondary | ICD-10-CM

## 2014-03-17 ENCOUNTER — Encounter (HOSPITAL_COMMUNITY)
Admission: RE | Admit: 2014-03-17 | Discharge: 2014-03-17 | Disposition: A | Discharge status: Home / Self Care | Payer: BC Managed Care – PPO | Source: Ambulatory Visit | Attending: Endocrinology | Admitting: Endocrinology

## 2014-03-17 DIAGNOSIS — C73 Malignant neoplasm of thyroid gland: Secondary | ICD-10-CM | POA: Insufficient documentation

## 2014-03-17 MED ORDER — THYROTROPIN ALFA 1.1 MG IM SOLR
0.9000 mg | INTRAMUSCULAR | Status: AC
  Administered 2014-03-17: 0.9 mg via INTRAMUSCULAR
  Filled 2014-03-17: qty 0.9

## 2014-03-18 ENCOUNTER — Encounter (HOSPITAL_COMMUNITY)
Admission: RE | Admit: 2014-03-18 | Discharge: 2014-03-18 | Disposition: A | Discharge status: Home / Self Care | Payer: BC Managed Care – PPO | Source: Ambulatory Visit | Attending: Endocrinology | Admitting: Endocrinology

## 2014-03-18 MED ORDER — THYROTROPIN ALFA 1.1 MG IM SOLR
0.9000 mg | INTRAMUSCULAR | Status: AC
  Administered 2014-03-18: 0.9 mg via INTRAMUSCULAR
  Filled 2014-03-18: qty 0.9

## 2014-03-19 ENCOUNTER — Telehealth: Payer: Self-pay | Admitting: *Deleted

## 2014-03-19 ENCOUNTER — Encounter (HOSPITAL_COMMUNITY)
Admission: RE | Admit: 2014-03-19 | Discharge: 2014-03-19 | Disposition: A | Discharge status: Home / Self Care | Payer: BC Managed Care – PPO | Source: Ambulatory Visit | Attending: Endocrinology | Admitting: Endocrinology

## 2014-03-19 ENCOUNTER — Other Ambulatory Visit: Payer: BC Managed Care – PPO

## 2014-03-19 MED ORDER — SODIUM IODIDE I 131 CAPSULE
76.7000 | Freq: Once | INTRAVENOUS | Status: AC | PRN
  Administered 2014-03-19: 76.7 via ORAL

## 2014-03-19 NOTE — Telephone Encounter (Signed)
1 week more

## 2014-03-19 NOTE — Telephone Encounter (Signed)
Patient wants to know if he still needs to be on a low iodine diet since he's finish his radioactive Iodine treatment?

## 2014-03-19 NOTE — Telephone Encounter (Signed)
Patient is aware 

## 2014-03-22 ENCOUNTER — Ambulatory Visit: Payer: BC Managed Care – PPO | Admitting: Endocrinology

## 2014-03-29 ENCOUNTER — Encounter (HOSPITAL_COMMUNITY)
Admission: RE | Admit: 2014-03-29 | Discharge: 2014-03-29 | Disposition: A | Discharge status: Home / Self Care | Payer: BC Managed Care – PPO | Source: Ambulatory Visit | Attending: Endocrinology | Admitting: Endocrinology

## 2014-03-29 ENCOUNTER — Encounter: Payer: Self-pay | Admitting: Endocrinology

## 2014-03-29 DIAGNOSIS — C73 Malignant neoplasm of thyroid gland: Secondary | ICD-10-CM | POA: Insufficient documentation

## 2014-04-11 ENCOUNTER — Encounter (INDEPENDENT_AMBULATORY_CARE_PROVIDER_SITE_OTHER): Payer: Self-pay | Admitting: General Surgery

## 2014-04-12 ENCOUNTER — Other Ambulatory Visit (INDEPENDENT_AMBULATORY_CARE_PROVIDER_SITE_OTHER): Payer: Self-pay | Admitting: General Surgery

## 2014-04-12 MED ORDER — LEVOTHYROXINE SODIUM 150 MCG PO TABS
150.0000 ug | ORAL_TABLET | Freq: Every day | ORAL | Status: DC
Start: 2014-04-12 — End: 2014-04-12

## 2014-04-12 MED ORDER — LEVOTHYROXINE SODIUM 150 MCG PO TABS: 150.0000 ug | ORAL_TABLET | Freq: Every day | ORAL | Status: DC

## 2014-07-05 ENCOUNTER — Ambulatory Visit: Payer: BC Managed Care – PPO

## 2014-07-05 ENCOUNTER — Other Ambulatory Visit: Payer: Self-pay | Admitting: *Deleted

## 2014-07-05 ENCOUNTER — Other Ambulatory Visit (INDEPENDENT_AMBULATORY_CARE_PROVIDER_SITE_OTHER): Payer: BC Managed Care – PPO

## 2014-07-05 ENCOUNTER — Other Ambulatory Visit: Payer: Self-pay | Admitting: Endocrinology

## 2014-07-05 DIAGNOSIS — C73 Malignant neoplasm of thyroid gland: Secondary | ICD-10-CM

## 2014-07-05 DIAGNOSIS — E041 Nontoxic single thyroid nodule: Secondary | ICD-10-CM

## 2014-07-05 LAB — TSH: TSH: 1.49 u[IU]/mL (ref 0.35–4.50)

## 2014-07-05 LAB — T4, FREE: FREE T4: 0.94 ng/dL (ref 0.60–1.60)

## 2014-07-06 LAB — THYROGLOBULIN LEVEL: THYROGLOBULIN: 2.9 ng/mL (ref 0.0–55.0)

## 2014-07-08 ENCOUNTER — Encounter (INDEPENDENT_AMBULATORY_CARE_PROVIDER_SITE_OTHER): Payer: Self-pay | Admitting: General Surgery

## 2014-07-08 ENCOUNTER — Encounter: Payer: Self-pay | Admitting: Endocrinology

## 2014-07-08 ENCOUNTER — Ambulatory Visit (INDEPENDENT_AMBULATORY_CARE_PROVIDER_SITE_OTHER): Payer: BC Managed Care – PPO | Admitting: Endocrinology

## 2014-07-08 VITALS — BP 132/97 | HR 71 | Temp 98.0°F | Resp 16 | Ht 70.5 in | Wt 181.8 lb

## 2014-07-08 DIAGNOSIS — E890 Postprocedural hypothyroidism: Secondary | ICD-10-CM | POA: Insufficient documentation

## 2014-07-08 DIAGNOSIS — C73 Malignant neoplasm of thyroid gland: Secondary | ICD-10-CM

## 2014-07-08 NOTE — Progress Notes (Signed)
Patient ID: Cameron Perez, male   DOB: 1966/07/07, 48 y.o.   MRN: [id number]   Reason for Appointment:  Followup of thyroid   History of Present Illness:   He had thyroidectomy for his papillary thyroid carcinoma in 01/2014 Surgical pathology showed the following: PAPILLARY THYROID CARCINOMA, TWO FOCI, 1.7 CM RIGHT LOBE AND 0.3 CM LEFT LOBE, margins not involved. - FOCAL EXTRATHYROID EXTENSION. Margins: Free of tumor. Lymph - Vascular invasion: Present. 1 positive lymph node  He had 76 mCi of I-131 for remnant ablation on 03/29/14 His followup body scan showed 3 foci of uptake in the neck with possible uptake in one lymph node  Subsequently has been on thyroid supplement which he has been taking regularly in the morning about half hour before breakfast He feels fairly good without any unusual fatigue, leg cramps or tingling He still continues to take the calcium that was started in the hospital when he had temporary hypocalcemia; taking this right after breakfast   Lab Results  Component Value Date   FREET4 0.94 07/05/2014   FREET4 1.63* 03/03/2014   TSH 1.49 07/05/2014   TSH 0.68 03/03/2014        Medication List       This list is accurate as of: 07/08/14  8:02 AM.  Always use your most recent med list.               B-complex with vitamin C tablet  Take 1 tablet by mouth daily.     CAL-MAG-ZINC PO  Take 1 tablet by mouth daily.     calcium-vitamin D 500-200 MG-UNIT per tablet  Commonly known as:  OSCAL WITH D  Take 3 tablets by mouth 3 (three) times daily.     Chromium 500 MCG Tabs  Take 1 tablet by mouth daily.     CIALIS 10 MG tablet  Generic drug:  tadalafil  Take 10 mg by mouth daily as needed for erectile dysfunction.     clomiPHENE 50 MG tablet  Commonly known as:  CLOMID  Take 50 mg by mouth daily.     L-Arginine 500 MG Caps  Take 1 capsule by mouth daily.     levothyroxine 150 MCG tablet  Commonly known as:  SYNTHROID, LEVOTHROID  Take 1 tablet  (150 mcg total) by mouth daily before breakfast.     multivitamin tablet  Take 1 tablet by mouth daily.     omega-3 acid ethyl esters 1 G capsule  Commonly known as:  LOVAZA  Take 1 g by mouth daily.     OVER THE COUNTER MEDICATION  Take 2 tablets by mouth daily. andro 400        Allergies: No Known Allergies  Past Medical History  Diagnosis Date  . Thyroid disease   . Cancer     thryoid  . History of kidney stones     Past Surgical History  Procedure Laterality Date  . Lithotripsy  2011  . Tibia fracture surgery      left leg  . Thyroidectomy N/A 02/09/2014    Procedure: THYROIDECTOMY;  Surgeon: [name], MD;  Location: [facility];  Service: General;  Laterality: N/A;    Family History  Problem Relation Age of Onset  . Cancer Mother     breast  . Cancer Maternal Grandmother     breast    Social History:  reports that he quit smoking about 11 years ago. He has never used smokeless tobacco. He reports that  he drinks about .6 ounces of alcohol per week. He reports that he does not use illicit drugs.   Review of Systems:    no history of Diabetes.   EXAM:  Neck exam shows no thyroid mass or lymphadenopathy Biceps reflexes normal           Assessment/Plan:  Patient has a 1.7 cm papillary carcinoma on the right side with no evidence of metastases on body scan He has had ablation of his thyroid remnant Current thyroglobulin level is 2.9 and this may be still related to her remnant thyroid tissue or small amount of tumor not completely ablated as yet  He will be scheduled for a three-month followup with repeat thyroglobulin level Also consider repeat body scan if thyroglobulin not undetectable  His TSH is normal although slightly higher than the last time; will have him stop his calcium that he is taking at breakfast since he did not need this now and may be having mild interaction with his Synthroid  [facility] 07/08/2014

## 2014-07-08 NOTE — Patient Instructions (Signed)
Stop calcium.

## 2014-07-09 ENCOUNTER — Encounter: Payer: Self-pay | Admitting: Gastroenterology

## 2014-08-04 ENCOUNTER — Telehealth (INDEPENDENT_AMBULATORY_CARE_PROVIDER_SITE_OTHER): Payer: Self-pay

## 2014-08-04 NOTE — Telephone Encounter (Signed)
Pt does not have a PCP to manage his Synthroid.  Please advise.

## 2014-08-04 NOTE — Telephone Encounter (Signed)
Called and spoke to Lorenz Park, Pharmacy Tech @ [facility] to make aware that patient needs to contact PCP for continuation of levothryoxine refills.  Mickel Baas will make note in computer regarding future refills.

## 2014-08-05 ENCOUNTER — Encounter: Payer: Self-pay | Admitting: Endocrinology

## 2014-08-05 ENCOUNTER — Other Ambulatory Visit: Payer: Self-pay | Admitting: *Deleted

## 2014-08-05 MED ORDER — LEVOTHYROXINE SODIUM 150 MCG PO TABS: 150.0000 ug | ORAL_TABLET | Freq: Every day | ORAL | Status: DC

## 2014-08-06 NOTE — Telephone Encounter (Signed)
Patient seeing an Endocrinologist (Dr. Dwyane Dee) for management of his Thyroid medication.

## 2014-08-25 ENCOUNTER — Ambulatory Visit (INDEPENDENT_AMBULATORY_CARE_PROVIDER_SITE_OTHER): Payer: BC Managed Care – PPO | Admitting: Family Medicine

## 2014-08-25 ENCOUNTER — Ambulatory Visit (INDEPENDENT_AMBULATORY_CARE_PROVIDER_SITE_OTHER): Payer: BC Managed Care – PPO

## 2014-08-25 VITALS — BP 136/82 | HR 73 | Temp 97.8°F | Resp 16 | Ht 69.5 in | Wt 179.6 lb

## 2014-08-25 DIAGNOSIS — S43422A Sprain of left rotator cuff capsule, initial encounter: Secondary | ICD-10-CM

## 2014-08-25 DIAGNOSIS — S43429A Sprain of unspecified rotator cuff capsule, initial encounter: Secondary | ICD-10-CM

## 2014-08-25 DIAGNOSIS — M25519 Pain in unspecified shoulder: Secondary | ICD-10-CM

## 2014-08-25 DIAGNOSIS — M25512 Pain in left shoulder: Secondary | ICD-10-CM

## 2014-08-25 MED ORDER — MELOXICAM 15 MG PO TABS: 15.0000 mg | ORAL_TABLET | Freq: Every day | ORAL | Status: DC

## 2014-08-25 NOTE — Progress Notes (Signed)
  Cameron Perez - 48 y.o. male MRN [id number]  Date of birth: 1966/01/31  SUBJECTIVE:  Including CC & ROS.  patient C/O: Left shoulder pain Onset of symptoms:  2 weeks ago patient fell down a set of stairs landing on his left elbow. He felt this jar his shoulder but denies dislocation. He denies any bruising of the shoulder but soreness and abrasion of the left elbow.  Symptoms: Shoulder soreness, pain is sharp with motion in all direction, loss of ROM due to pain, no numbness or tingling. No previous injury. Pain at night when laying on left shoulder but pain does not wake him up at night.  Relieving factors: no movement, has tried OTC NSAID once with no relief Worsened by: overhead activities and any movements   ROS:  Constitutional:  No fever, chills, or fatigue.  Respiratory:  No shortness of breath, cough, or wheezing Cardiovascular:  No palpitations, chest pain or syncope Review of systems otherwise negative except for what is stated in HPI  HISTORY: Past Medical, Surgical, Social, and Family History Reviewed & Updated per EMR. Pertinent Historical Findings include: S/P thyroidectomy due to thyroid cancer   PHYSICAL EXAM:  VS: BP:136/82 mmHg  HR:73bpm  TEMP:97.8 F (36.6 C)(Oral)  RESP:97 %  HT:5' 9.5" (176.5 cm)   WT:179 lb 9.6 oz (81.466 kg)  BMI:26.2 SHOULDER EXAM:  General: well nourished Skin of UE: warm; dry, no rashes, lesions, ecchymosis or erythema. Vascular: radial pulses 2+ bilaterally Neurologically: Sensation to light touch upper extremities equal and intact bilaterally. Normal sensation with no sensory or motor defects in C4-C8 Palpation: no tenderness over the [facility] joint, acromion, moderate bicipital grove tenderness, moderate supraspinatus tenderness L shoulder ROM active/passive: decrease ROM 90 degree of abduction and forward flexion with passive motion, internal (45) and external rotation (45) limited due to pain.  Strength testing: 4/5 strength in   external rotation, forward flexion, abduction, 5/5 in internal rotation     Special Test: Negative Neer's, neg Hawkins, positive empty can, neg O'Brien, neg     speeds, neg apprehension  ASSESSMENT & PLAN:  Rotator cuff Tendonitis vs. Partial tear of supraspinatus and/or subscapularis  Recommendations: -Option for steroid injection or oral NSAID'S or both patient decided to proceed with both. See Injection note below -Patient agreeable with referral for PT to work on strength and ROM -Recommend f/u in 3-4 week if no improvement for referral for MRI to evaluate possible ROM traumatic tear.   Injection procedure: Consent obtained and verified. Sterile betadine prep. Furthur cleansed with alcohol. Topical analgesic spray: Ethyl chloride. Injection Indication: Subacromial / intraarticular glenoid injection Approached in typical fashion with: Completed without difficulty Meds: 1cc of 40mg  Kenolog, 3cc of lidocaine  Needle: 22g 1.5in Aftercare instructions and Red flags advised. Advised to call if fevers/chills, erythema, induration, drainage, or persistent bleeding.

## 2014-08-25 NOTE — Progress Notes (Signed)
[facility] reading (PRIMARY) by  Dr. Carlota Raspberry: L shoulder - no apparent fx or acute findings.   Xray read and patient discussed with Dr. Ollen Barges. Agree with assessment and plan of care per her note, and secondary exam performed by me, present during injection.

## 2014-08-25 NOTE — Patient Instructions (Signed)
Rotator Cuff Tear The rotator cuff is four tendons that assist in the motion of the shoulder. A rotator cuff tear is a tear in one of these four tendons. It is characterized by pain and weakness of the shoulder. The rotator cuff tendons surround the shoulder ball and socket joint (humeral head). The rotator cuff tendons attach to the shoulder blade (scapula) on one side and the upper arm bone (humerus) on the other side. The rotator cuff is essential for shoulder stability and shoulder motion. SYMPTOMS   Pain around the shoulder, often at the outer portion of the upper arm.  Pain that is worse with shoulder function, especially when reaching overhead or lifting.  Weakness of the shoulder muscles.  Aching when not using your arm; often, pain awakens you at night, especially when sleeping on the affected side.  Tenderness, swelling, warmth, or redness over the outer aspect of the shoulder.  Loss of strength.  Limited motion of the shoulder, especially reaching behind (reaching into one's back pocket) or across your body.  A crackling sound (crepitation) when moving the shoulder.  Biceps tendon pain (in the front of the shoulder) and inflammation, worse with bending the elbow or lifting. CAUSES   Strain from sudden increase in amount or intensity of activity.  Direct blow or injury to the shoulder.  Aging, wear from from normal use.  Roof of the shoulder (acromial) spur. RISK INCREASES WITH:   Contact sports (football, wrestling, or boxing).  Throwing or hitting sports (baseball, tennis, or volleyball).  Weightlifting and bodybuilding.  Heavy labor.  Previous injury to rotator cuff.  Failure to warm up properly before activity.  Inadequate protective equipment.  Increasing age.  Spurring of the outer end of the scapula (acromion).  Cortisone injections.  Poor shoulder strength and flexibility. PREVENTION  Warm up and stretch properly before activity.  Allow time  for rest and recovery between practices and competition.  Maintain physical fitness:  Cardiovascular fitness.  Shoulder flexibility.  Strength and endurance of the rotator cuff muscles and muscles of the shoulder blade.  Learn and use proper technique when throwing or hitting. PROGNOSIS Surgery is often needed. Although, symptoms may go away by themselves. RELATED COMPLICATIONS   Persistent pain that may progress to constant pain.  Shoulder stiffness, frozen shoulder syndrome, or loss of motion.  Recurrence of symptoms, especially if treated without surgery.  Inability to return to same level of sports, even with surgery.  Persistent weakness.  Risks of surgery, including infection, bleeding, injury to nerves, shoulder stiffness, weakness, re-tearing of the rotator cuff tendon.  Deltoid detachment, acromial fracture, and persistent pain. TREATMENT Treatment involves the use of ice and medicine to reduce pain and inflammation. Strengthening and stretching exercise are usually recommended. These exercises may be completed at home or with a therapist. You may also be instructed to modify offending activities. Corticosteroid injections may be given to reduce inflammation. Surgery is usually recommended for athletes. Surgery has the best chance for a full recovery. Surgery involves:  Removal of an inflamed bursa.  Removal of an acromial spur if present.  Suturing the torn tendon back together. Rotator cuff surgeries may be preformed either arthroscopically or through an open incision. Recovery typically takes 6 to 12 months. MEDICATION  If pain medicine is necessary, then nonsteroidal anti-inflammatory medicines, such as aspirin and ibuprofen, or other minor pain relievers, such as acetaminophen, are often recommended.  Do not take pain medicine for 7 days before surgery.  Prescription pain relievers are usually  only prescribed after surgery. Use only as directed and only as  much as you need. °· Corticosteroid injections may be given to reduce inflammation. However, there is a limited number of times the joint may be injected with these medicines. °HEAT AND COLD °· Cold treatment (icing) relieves pain and reduces inflammation. Cold treatment should be applied for 10 to 15 minutes every 2 to 3 hours for inflammation and pain and immediately after any activity that aggravates your symptoms. Use ice packs or massage the area with a piece of ice (ice massage). °· Heat treatment may be used prior to performing the stretching and strengthening activities prescribed by your caregiver, physical therapist, or athletic trainer. Use a heat pack or soak the injury in warm water. °SEEK MEDICAL CARE IF:  °· Symptoms get worse or do not improve in 4 to 6 weeks despite treatment. °· You experience pain, numbness, or coldness in the hand. °· Blue, gray, or dark color appears in the fingernails. °· New, unexplained symptoms develop (drugs used in treatment may produce side effects). °Document Released: 12/17/2005 Document Revised: 03/10/2012 Document Reviewed: 03/31/2009 °Exit[employer]® Patient Information ©2015 [employer]. This information is not intended to replace advice given to you by your health care provider. Make sure you discuss any questions you have with your health care provider. ° °

## 2014-09-02 NOTE — Addendum Note (Signed)
Addended by: Constance Goltz on: 09/02/2014 01:34 PM   Modules accepted: Orders

## 2014-09-03 ENCOUNTER — Other Ambulatory Visit: Payer: Self-pay | Admitting: *Deleted

## 2014-09-03 MED ORDER — LEVOTHYROXINE SODIUM 150 MCG PO TABS: 150.0000 ug | ORAL_TABLET | Freq: Every day | ORAL | Status: DC

## 2014-09-05 ENCOUNTER — Encounter: Payer: Self-pay | Admitting: Endocrinology

## 2014-09-07 ENCOUNTER — Other Ambulatory Visit: Payer: Self-pay | Admitting: *Deleted

## 2014-09-07 MED ORDER — LEVOTHYROXINE SODIUM 150 MCG PO TABS: 150.0000 ug | ORAL_TABLET | Freq: Every day | ORAL | Status: DC

## 2014-09-21 ENCOUNTER — Ambulatory Visit (INDEPENDENT_AMBULATORY_CARE_PROVIDER_SITE_OTHER): Payer: BC Managed Care – PPO | Admitting: Gastroenterology

## 2014-09-21 ENCOUNTER — Encounter: Payer: Self-pay | Admitting: Gastroenterology

## 2014-09-21 VITALS — BP 144/84 | HR 80 | Ht 69.0 in | Wt 174.4 lb

## 2014-09-21 DIAGNOSIS — Z1211 Encounter for screening for malignant neoplasm of colon: Secondary | ICD-10-CM | POA: Insufficient documentation

## 2014-09-21 NOTE — Progress Notes (Signed)
_                                                                                                                History of Present Illness:  Cameron Perez is a pleasant 48 year old white male self-referred to arrange for screening colonoscopy.  He has no GI complaints including change of bowel habits, abdominal pain or rectal bleeding.   Past Medical History  Diagnosis Date  . Thyroid disease   . History of kidney stones 2012  . Thyroid cancer 2015   Past Surgical History  Procedure Laterality Date  . Lithotripsy  2011  . Tibia fracture surgery Left     leg  . Thyroidectomy N/A 02/09/2014    Procedure: THYROIDECTOMY;  Surgeon: [name], MD;  Location: [facility];  Service: General;  Laterality: N/A;   family history includes Breast cancer in his maternal grandmother and mother; Diabetes in his paternal grandfather. Current Outpatient Prescriptions  Medication Sig Dispense Refill  . B Complex-C (B-COMPLEX WITH VITAMIN C) tablet Take 1 tablet by mouth daily.      . Calcium-Magnesium-Zinc (CAL-MAG-ZINC PO) Take 1 tablet by mouth daily.      . Chromium 500 MCG TABS Take 1 tablet by mouth daily.      Marland Kitchen CIALIS 10 MG tablet Take 10 mg by mouth daily as needed for erectile dysfunction.       . clomiPHENE (CLOMID) 50 MG tablet Take 50 mg by mouth daily.       Marland Kitchen L-Arginine 500 MG CAPS Take 1 capsule by mouth daily.      Marland Kitchen levothyroxine (SYNTHROID, LEVOTHROID) 150 MCG tablet Take 1 tablet (150 mcg total) by mouth daily before breakfast.  90 tablet  1  . meloxicam (MOBIC) 15 MG tablet Take 1 tablet (15 mg total) by mouth daily.  30 tablet  0  . Multiple Vitamin (MULTIVITAMIN) tablet Take 1 tablet by mouth daily.      Marland Kitchen omega-3 acid ethyl esters (LOVAZA) 1 G capsule Take 1 g by mouth daily.      Marland Kitchen OVER THE COUNTER MEDICATION Take 2 tablets by mouth daily. andro 400       No current facility-administered medications for this visit.   Allergies as of 09/21/2014  . (No  Known Allergies)    reports that he quit smoking about 11 years ago. His smoking use included Cigarettes. He smoked 0.00 packs per day. He quit smokeless tobacco use about 7 years ago. His smokeless tobacco use included Chew. He reports that he drinks about .6 ounces of alcohol per week. He reports that he does not use illicit drugs.   Review of Systems: Pertinent positive and negative review of systems were noted in the above HPI section. All other review of systems were otherwise negative.  Vital signs were reviewed in today's medical record Physical Exam: General: Well developed , well nourished, no acute distress Skin: anicteric Head: Normocephalic and atraumatic Eyes:  sclerae anicteric, EOMI Ears: Normal auditory acuity Mouth: No  deformity or lesions Neck: Supple, no masses or thyromegaly Lungs: Clear throughout to auscultation Heart: Regular rate and rhythm; no murmurs, rubs or bruits Abdomen: Soft, non tender and non distended. No masses, hepatosplenomegaly or hernias noted. Normal Bowel sounds Rectal:deferred Musculoskeletal: Symmetrical with no gross deformities  Skin: No lesions on visible extremities Pulses:  Normal pulses noted Extremities: No clubbing, cyanosis, edema or deformities noted Neurological: Alert oriented x 4, grossly nonfocal Cervical Nodes:  No significant cervical adenopathy Inguinal Nodes: No significant inguinal adenopathy Psychological:  Alert and cooperative. Normal mood and affect  See Assessment and Plan under Problem List

## 2014-09-21 NOTE — Assessment & Plan Note (Signed)
Plan screening colonoscopy 

## 2014-09-21 NOTE — Patient Instructions (Signed)
You have been scheduled for a colonoscopy. Please follow written instructions given to you at your visit today.  Please pick up your prep kit at the pharmacy within the next 1-3 days. If you use inhalers (even only as needed), please bring them with you on the day of your procedure. Your physician has requested that you go to www.startemmi.com and enter the access code given to you at your visit today. This web site gives a general overview about your procedure. However, you should still follow specific instructions given to you by our office regarding your preparation for the procedure.  Sample Suprep kit given

## 2014-09-24 ENCOUNTER — Ambulatory Visit (AMBULATORY_SURGERY_CENTER): Payer: BC Managed Care – PPO | Admitting: Gastroenterology

## 2014-09-24 ENCOUNTER — Encounter: Payer: Self-pay | Admitting: Gastroenterology

## 2014-09-24 VITALS — BP 130/72 | HR 62 | Temp 97.0°F | Resp 22 | Ht 69.0 in | Wt 174.0 lb

## 2014-09-24 DIAGNOSIS — Z1211 Encounter for screening for malignant neoplasm of colon: Secondary | ICD-10-CM

## 2014-09-24 DIAGNOSIS — K5730 Diverticulosis of large intestine without perforation or abscess without bleeding: Secondary | ICD-10-CM

## 2014-09-24 MED ORDER — SODIUM CHLORIDE 0.9 % IV SOLN: 500.0000 mL | INTRAVENOUS | Status: DC

## 2014-09-24 NOTE — Op Note (Signed)
[place]  [address] [place], [id number]   COLONOSCOPY PROCEDURE REPORT  PATIENT: Cameron, Perez  MR#: [id number] BIRTHDATE: 1966-07-19 , 48  yrs. old GENDER: male ENDOSCOPIST: Inda Castle, MD REFERRED BY: PROCEDURE DATE:  09/24/2014 PROCEDURE:   Colonoscopy, diagnostic First Screening Colonoscopy - Avg.  risk and is 50 yrs.  old or older Yes.  Prior Negative Screening - Now for repeat screening. N/A  History of Adenoma - Now for follow-up colonoscopy & has been > or = to 3 yrs.  N/A  Polyps Removed Today? No.  Recommend repeat exam, <10 yrs? No. ASA CLASS:   Class II INDICATIONS:average risk for colon cancer. MEDICATIONS: Monitored anesthesia care and Propofol 200 mg IV  DESCRIPTION OF PROCEDURE:   After the risks benefits and alternatives of the procedure were thoroughly explained, informed consent was obtained.  The digital rectal exam revealed no abnormalities of the rectum.   The LB [id number]  endoscope was introduced through the anus and advanced to the cecum, which was identified by both the appendix and ileocecal valve. No adverse events experienced.   The quality of the prep was excellent using Suprep  The instrument was then slowly withdrawn as the colon was fully examined.      COLON FINDINGS: There was mild diverticulosis noted in the sigmoid colon.   The colon mucosa was otherwise normal.  Retroflexed views revealed no abnormalities. The time to cecum=1 minutes 16 seconds. Withdrawal time=6 minutes 06 seconds.  The scope was withdrawn and the procedure completed. COMPLICATIONS: There were no complications.  ENDOSCOPIC IMPRESSION: 1.   Mild diverticulosis was noted in the sigmoid colon 2.   The colon mucosa was otherwise normal  RECOMMENDATIONS: Continue current colorectal screening recommendations for "routine risk" patients with a repeat colonoscopy in 10 years.  eSigned:  Inda Castle, MD 09/24/2014 2:35  PM   cc:   PATIENT NAME:  Cameron, Perez MR#: [id number]

## 2014-09-24 NOTE — Progress Notes (Signed)
Report to PACU, RN, vss, BBS= Clear.  

## 2014-09-24 NOTE — Patient Instructions (Signed)
YOU HAD AN ENDOSCOPIC PROCEDURE TODAY AT THE [facility]: Refer to the procedure report that was given to you for any specific questions about what was found during the examination.  If the procedure report does not answer your questions, please call your gastroenterologist to clarify.  If you requested that your care partner not be given the details of your procedure findings, then the procedure report has been included in a sealed envelope for you to review at your convenience later.  YOU SHOULD EXPECT: Some feelings of bloating in the abdomen. Passage of more gas than usual.  Walking can help get rid of the air that was put into your GI tract during the procedure and reduce the bloating. If you had a lower endoscopy (such as a colonoscopy or flexible sigmoidoscopy) you may notice spotting of blood in your stool or on the toilet paper. If you underwent a bowel prep for your procedure, then you may not have a normal bowel movement for a few days.  DIET: Your first meal following the procedure should be a light meal and then it is ok to progress to your normal diet.  A half-sandwich or bowl of soup is an example of a good first meal.  Heavy or fried foods are harder to digest and may make you feel nauseous or bloated.  Likewise meals heavy in dairy and vegetables can cause extra gas to form and this can also increase the bloating.  Drink plenty of fluids but you should avoid alcoholic beverages for 24 hours.  ACTIVITY: Your care partner should take you home directly after the procedure.  You should plan to take it easy, moving slowly for the rest of the day.  You can resume normal activity the day after the procedure however you should NOT DRIVE or use heavy machinery for 24 hours (because of the sedation medicines used during the test).    SYMPTOMS TO REPORT IMMEDIATELY: A gastroenterologist can be reached at any hour.  During normal business hours, 8:30 AM to 5:00 PM Monday through Friday,  call [phone].  After hours and on weekends, please call the GI answering service at [phone] who will take a message and have the physician on call contact you.   Following lower endoscopy (colonoscopy or flexible sigmoidoscopy):  Excessive amounts of blood in the stool  Significant tenderness or worsening of abdominal pains  Swelling of the abdomen that is new, acute  Fever of 100F or higher    FOLLOW UP: If any biopsies were taken you will be contacted by phone or by letter within the next 1-3 weeks.  Call your gastroenterologist if you have not heard about the biopsies in 3 weeks.  Our staff will call the home number listed on your records the next business day following your procedure to check on you and address any questions or concerns that you may have at that time regarding the information given to you following your procedure. This is a courtesy call and so if there is no answer at the home number and we have not heard from you through the emergency physician on call, we will assume that you have returned to your regular daily activities without incident.  SIGNATURES/CONFIDENTIALITY: You and/or your care partner have signed paperwork which will be entered into your electronic medical record.  These signatures attest to the fact that that the information above on your After Visit Summary has been reviewed and is understood.  Full responsibility of the confidentiality   of this discharge information lies with you and/or your care-partner.     

## 2014-09-27 ENCOUNTER — Telehealth: Payer: Self-pay | Admitting: *Deleted

## 2014-09-27 NOTE — Telephone Encounter (Signed)
  Follow up Call-  Call back number 09/24/2014  Post procedure Call Back phone  # [phone]  Permission to leave phone message Yes     Patient questions:  Do you have a fever, pain , or abdominal swelling? No. Pain Score  0 *  Have you tolerated food without any problems? Yes.    Have you been able to return to your normal activities? Yes.    Do you have any questions about your discharge instructions: Diet   No. Medications  No. Follow up visit  No.  Do you have questions or concerns about your Care? No.  Actions: * If pain score is 4 or above: No action needed, pain <4.

## 2014-10-01 ENCOUNTER — Other Ambulatory Visit (INDEPENDENT_AMBULATORY_CARE_PROVIDER_SITE_OTHER): Payer: BC Managed Care – PPO

## 2014-10-01 DIAGNOSIS — C73 Malignant neoplasm of thyroid gland: Secondary | ICD-10-CM

## 2014-10-01 LAB — TSH: TSH: 3.26 u[IU]/mL (ref 0.35–4.50)

## 2014-10-02 LAB — THYROGLOBULIN LEVEL: THYROGLOBULIN: 3.6 ng/mL (ref 2.8–40.9)

## 2014-10-08 ENCOUNTER — Encounter: Payer: Self-pay | Admitting: Endocrinology

## 2014-10-08 ENCOUNTER — Ambulatory Visit (INDEPENDENT_AMBULATORY_CARE_PROVIDER_SITE_OTHER): Payer: BC Managed Care – PPO | Admitting: Endocrinology

## 2014-10-08 VITALS — BP 153/77 | HR 74 | Temp 98.0°F | Resp 14 | Ht 69.0 in | Wt 170.8 lb

## 2014-10-08 DIAGNOSIS — E890 Postprocedural hypothyroidism: Secondary | ICD-10-CM

## 2014-10-08 DIAGNOSIS — C73 Malignant neoplasm of thyroid gland: Secondary | ICD-10-CM

## 2014-10-08 NOTE — Progress Notes (Signed)
Patient ID: Cameron Perez, male   DOB: Sep 19, 1966, 48 y.o.   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 - FOCAL EXTRATHYROID EXTENSION. Margins: Free of tumor.  Lymph - Vascular invasion: Present. 1 positive lymph node  He had 76 mCi of I-131 for remnant ablation on 03/29/14 His post therapy body scan showed 3 foci of uptake in the neck with possible uptake in one lymph node  His thyroglobulin level is relatively high at 3.6, previously 2.9  HYPOTHYROIDISM Subsequently has been on thyroid supplement which he has been taking regularly in the morning about half hour before breakfast He feels fairly good without any unusual fatigue but may be having a little cold sensitivity He says he feels better with switching from Levoxyl to Synthroid brand Also his calcium was stopped in the morning and he does not take any supplements or vitamins in the morning now  Not clear why his TSH is trending higher    Lab Results  Component Value Date   FREET4 0.94 07/05/2014   FREET4 1.63* 03/03/2014   TSH 3.26 10/01/2014   TSH 1.49 07/05/2014   TSH 0.68 03/03/2014        Medication List       This list is accurate as of: 10/08/14  8:36 AM.  Always use your most recent med list.               B-complex with vitamin C tablet  Take 1 tablet by mouth daily.     Chromium 500 MCG Tabs  Take 1 tablet by mouth daily.     CIALIS 10 MG tablet  Generic drug:  tadalafil  Take 10 mg by mouth daily as needed for erectile dysfunction.     clomiPHENE 50 MG tablet  Commonly known as:  CLOMID  Take 50 mg by mouth daily.     L-Arginine 500 MG Caps  Take 1 capsule by mouth daily.     levothyroxine 150 MCG tablet  Commonly known  as:  SYNTHROID, LEVOTHROID  Take 150 mcg by mouth daily before breakfast. DAW     meloxicam 15 MG tablet  Commonly known as:  MOBIC  Take 1 tablet (15 mg total) by mouth daily.     multivitamin tablet  Take 1 tablet by mouth daily.     OVER THE COUNTER MEDICATION  Take 2 tablets by mouth daily. andro 400        Allergies: No Known Allergies  Past Medical History  Diagnosis Date  . Thyroid disease   . History of kidney stones 2012  . Thyroid cancer 2015    Past Surgical History  Procedure Laterality Date  . Lithotripsy  2011  . Tibia fracture surgery Left     leg  . Thyroidectomy N/A 02/09/2014    Procedure: THYROIDECTOMY;  Surgeon: [name], MD;  Location: [facility];  Service: General;  Laterality: N/A;    Family History  Problem Relation Age of Onset  . Breast cancer Mother   . Breast cancer Maternal Grandmother   . Diabetes Paternal Grandfather   . Colon cancer Neg Hx     Social History:  reports that he quit smoking about 11 years ago. His smoking use included Cigarettes.  He smoked 0.00 packs per day. He quit smokeless tobacco use about 7 years ago. His smokeless tobacco use included Chew. He reports that he drinks about .6 ounces of alcohol per week. He reports that he does not use illicit drugs.   Review of Systems:    no history of Diabetes.   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 If TSH still not close to 1.0 will change to 175 mcg He will be scheduled for a  three-month followup with repeat thyroid levels   Cameron Perez 10/08/2014

## 2014-10-08 NOTE — Patient Instructions (Signed)
Extra 1/2 pill once a week 

## 2014-10-18 ENCOUNTER — Encounter: Payer: Self-pay | Admitting: Endocrinology

## 2014-11-01 ENCOUNTER — Encounter (HOSPITAL_COMMUNITY)
Admission: RE | Admit: 2014-11-01 | Discharge: 2014-11-01 | Disposition: A | Discharge status: Home / Self Care | Payer: BC Managed Care – PPO | Source: Ambulatory Visit | Attending: Endocrinology | Admitting: Endocrinology

## 2014-11-01 DIAGNOSIS — C73 Malignant neoplasm of thyroid gland: Secondary | ICD-10-CM | POA: Diagnosis present

## 2014-11-01 MED ORDER — THYROTROPIN ALFA 1.1 MG IM SOLR
0.9000 mg | INTRAMUSCULAR | Status: AC
  Administered 2014-11-01: 0.9 mg via INTRAMUSCULAR

## 2014-11-02 ENCOUNTER — Encounter (HOSPITAL_COMMUNITY)
Admission: RE | Admit: 2014-11-02 | Discharge: 2014-11-02 | Disposition: A | Discharge status: Home / Self Care | Payer: BC Managed Care – PPO | Source: Ambulatory Visit | Attending: Endocrinology | Admitting: Endocrinology

## 2014-11-02 DIAGNOSIS — C73 Malignant neoplasm of thyroid gland: Secondary | ICD-10-CM | POA: Diagnosis not present

## 2014-11-02 MED ORDER — THYROTROPIN ALFA 1.1 MG IM SOLR
0.9000 mg | INTRAMUSCULAR | Status: AC
  Administered 2014-11-02: 0.9 mg via INTRAMUSCULAR

## 2014-11-03 ENCOUNTER — Encounter (HOSPITAL_COMMUNITY)
Admission: RE | Admit: 2014-11-03 | Discharge: 2014-11-03 | Disposition: A | Discharge status: Home / Self Care | Payer: BC Managed Care – PPO | Source: Ambulatory Visit | Attending: Endocrinology | Admitting: Endocrinology

## 2014-11-03 MED ORDER — SODIUM IODIDE I 131 CAPSULE
4.0000 | Freq: Once | INTRAVENOUS | Status: AC | PRN
  Administered 2014-11-03: 4 via ORAL

## 2014-11-05 ENCOUNTER — Encounter (HOSPITAL_COMMUNITY)
Admission: RE | Admit: 2014-11-05 | Discharge: 2014-11-05 | Disposition: A | Discharge status: Home / Self Care | Payer: BC Managed Care – PPO | Source: Ambulatory Visit | Attending: Endocrinology | Admitting: Endocrinology

## 2014-11-05 DIAGNOSIS — C73 Malignant neoplasm of thyroid gland: Secondary | ICD-10-CM | POA: Diagnosis not present

## 2014-11-05 MED ORDER — SODIUM IODIDE I 131 CAPSULE: 4.0000 | Freq: Once | INTRAVENOUS | Status: AC | PRN

## 2014-11-06 LAB — THYROGLOBULIN ANTIBODY: Thyroglobulin Ab: <1 IU/mL (ref <2)

## 2014-11-06 LAB — THYROGLOBULIN LEVEL: THYROGLOBULIN: 3.9 ng/mL (ref 2.8–40.9)

## 2014-11-07 NOTE — Progress Notes (Signed)
Quick Note:  Please let patient know that the result is normal and no further action needed ______ 

## 2014-12-26 ENCOUNTER — Ambulatory Visit (INDEPENDENT_AMBULATORY_CARE_PROVIDER_SITE_OTHER): Payer: BC Managed Care – PPO | Admitting: Family Medicine

## 2014-12-26 ENCOUNTER — Encounter: Payer: Self-pay | Admitting: Internal Medicine

## 2014-12-26 VITALS — BP 132/86 | HR 77 | Temp 97.9°F | Resp 16 | Ht 70.0 in | Wt 171.6 lb

## 2014-12-26 DIAGNOSIS — J018 Other acute sinusitis: Secondary | ICD-10-CM

## 2014-12-26 MED ORDER — AMOXICILLIN 875 MG PO TABS: 875.0000 mg | ORAL_TABLET | Freq: Two times a day (BID) | ORAL | Status: DC

## 2014-12-26 NOTE — Progress Notes (Signed)
   Subjective:    Patient ID: Cameron Perez, male    DOB: [birth year], 48 y.o.   MRN: [id number] This chart was scribed for Robyn Haber, MD by Zola Button, Medical Scribe. This patient was seen in Room 3 and the patient's care was started at 4:00 PM.   HPI HPI Comments: Cameron Perez is a 48 y.o. male with a hx of thyroid cancer who presents to the [facility] complaining of gradual onset URI symptoms that began 2 days ago. Patient states he started sneezing 2 days ago, then developed other symptoms the following day, including myalgias, mild cough, sore throat, clear rhinorrhea and sinus issues. He has been taking acetaminophen for his symptoms. Patient denies any hearing and balance issues. He also denies smoking. NKDA.  Patient is a Government social research officer at [employer].  Review of Systems  HENT: Positive for rhinorrhea, sneezing and sore throat. Negative for hearing loss.   Respiratory: Positive for cough.   Musculoskeletal: Positive for myalgias. Negative for gait problem.       Objective:   Physical Exam CONSTITUTIONAL: Well developed/well nourished HEAD: Normocephalic/atraumatic EYES: EOM/PERRL ENMT: Mucous membranes moist NECK: supple no meningeal signs SPINE: entire spine nontender CV: S1/S2 noted, no murmurs/rubs/gallops noted LUNGS: Lungs are clear to auscultation bilaterally, no apparent distress ABDOMEN: soft, nontender, no rebound or guarding GU: no cva tenderness NEURO: Pt is awake/alert, moves all extremitiesx4 EXTREMITIES: pulses normal, full ROM SKIN: warm, color normal PSYCH: no abnormalities of mood noted  Nose is reddened, swollen with narrowed, red nasal passages and mucopurlent discharge      Assessment & Plan:   This chart was scribed in my presence and reviewed by me personally.    ICD-9-CM ICD-10-CM   1. Other acute sinusitis 461.8 J01.80 amoxicillin (AMOXIL) 875 MG tablet     Signed, Robyn Haber, MD

## 2014-12-26 NOTE — Patient Instructions (Signed)

## 2015-01-07 ENCOUNTER — Other Ambulatory Visit (INDEPENDENT_AMBULATORY_CARE_PROVIDER_SITE_OTHER): Payer: BC Managed Care – PPO

## 2015-01-07 DIAGNOSIS — E890 Postprocedural hypothyroidism: Secondary | ICD-10-CM

## 2015-01-07 LAB — TSH: TSH: 0.69 u[IU]/mL (ref 0.35–4.50)

## 2015-01-12 ENCOUNTER — Encounter: Payer: Self-pay | Admitting: Endocrinology

## 2015-01-12 ENCOUNTER — Ambulatory Visit (INDEPENDENT_AMBULATORY_CARE_PROVIDER_SITE_OTHER): Payer: BC Managed Care – PPO | Admitting: Endocrinology

## 2015-01-12 VITALS — BP 121/79 | HR 80 | Temp 98.3°F | Resp 14 | Ht 70.0 in | Wt 173.0 lb

## 2015-01-12 DIAGNOSIS — E890 Postprocedural hypothyroidism: Secondary | ICD-10-CM

## 2015-01-12 DIAGNOSIS — Z23 Encounter for immunization: Secondary | ICD-10-CM

## 2015-01-12 DIAGNOSIS — C73 Malignant neoplasm of thyroid gland: Secondary | ICD-10-CM

## 2015-01-12 NOTE — Progress Notes (Signed)
Patient ID: Cameron Perez, male   DOB: [birth year], 49 y.o.   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 - FOCAL EXTRATHYROID EXTENSION. Margins: Free of tumor.  Lymph - Vascular invasion: Present. 1 positive lymph node  He had 76 mCi of I-131 for remnant ablation on 03/29/14 His post therapy body scan showed 3 foci of uptake in the neck with possible uptake in one lymph node  Subsequent whole-body scan in 11/13 did not show any activity  His thyroglobulin level in 10/15 was relatively high at 3.6, previously 2.9  HYPOTHYROIDISM He has been on thyroid supplement which he has been taking regularly in the morning about half hour before breakfast He feels fairly good without any unusual fatigue  He says he feels better with switching from Levoxyl to Synthroid brand Not taking any calcium supplements Because of his TSH being 3.3 was told to take an extra half tablet once a week up to 150 g supplement and he is doing this regularly TSH is back to below 1.0      Lab Results  Component Value Date   FREET4 0.94 07/05/2014   FREET4 1.63* 03/03/2014   TSH 0.69 01/07/2015   TSH 3.26 10/01/2014   TSH 1.49 07/05/2014        Medication List       This list is accurate as of: 01/12/15 10:25 AM.  Always use your most recent med list.               B-complex with vitamin C tablet  Take 1 tablet by mouth daily.     Chromium 500 MCG Tabs  Take 1 tablet by mouth daily.     CIALIS 20 MG tablet  Generic drug:  tadalafil     clomiPHENE 50 MG tablet  Commonly known as:  CLOMID  Take 50 mg by mouth daily. Takes 1/2 tablet daily     L-Arginine 500 MG Caps  Take 1 capsule by mouth daily.     levothyroxine  150 MCG tablet  Commonly known as:  SYNTHROID, LEVOTHROID  Take 150 mcg by mouth daily before breakfast. DAW     meloxicam 15 MG tablet  Commonly known as:  MOBIC  Take 1 tablet (15 mg total) by mouth daily.     multivitamin tablet  Take 1 tablet by mouth daily.     OVER THE COUNTER MEDICATION  Take 2 tablets by mouth daily. andro 400        Allergies: No Known Allergies  Past Medical History  Diagnosis Date  . Thyroid disease   . History of kidney stones 2012  . Thyroid cancer 2015    Past Surgical History  Procedure Laterality Date  . Lithotripsy  2011  . Tibia fracture surgery Left     leg  . Thyroidectomy N/A 02/09/2014    Procedure: THYROIDECTOMY;  Surgeon: [name], MD;  Location: [facility];  Service: General;  Laterality: N/A;    Family History  Problem Relation Age of Onset  . Breast cancer Mother   . Breast cancer Maternal Grandmother   . Diabetes Paternal Grandfather   . Colon cancer Neg Hx     Social History:  reports that he quit smoking about  11 years ago. His smoking use included Cigarettes. He quit smokeless tobacco use about 8 years ago. His smokeless tobacco use included Chew. He reports that he drinks about 0.6 oz of alcohol per week. He reports that he does not use illicit drugs.   Review of Systems:    no history of Diabetes.   EXAM:  Neck exam shows no palpable abnormality in the thyroid bed or lymphadenopathy        Assessment/Plan:   Hypothyroidism:  His TSH is quite normal and now less than 1.0 with current regimen of 150 mcg , 7-1/2 tablets per week He is compliant with this regimen He feels fairly good and looks euthyroid   Thyroid cancer: History of 1.7 cm papillary thyroid carcinoma   He will be scheduled for a three-month followup with repeat thyroglobulin level since it was 3.6 without evidence of residual tumor on whole-body Consider a CT scan or ultrasound of neck/chest if the thyroglobulin is still high Plan  discussed with patient   [facility] 01/12/2015

## 2015-01-12 NOTE — Patient Instructions (Signed)
No change 

## 2015-02-11 ENCOUNTER — Other Ambulatory Visit: Payer: Self-pay | Admitting: Endocrinology

## 2015-04-13 ENCOUNTER — Other Ambulatory Visit: Payer: BC Managed Care – PPO

## 2015-04-18 ENCOUNTER — Ambulatory Visit: Payer: BC Managed Care – PPO | Admitting: Endocrinology

## 2015-05-03 ENCOUNTER — Other Ambulatory Visit (INDEPENDENT_AMBULATORY_CARE_PROVIDER_SITE_OTHER): Payer: 59

## 2015-05-03 DIAGNOSIS — E890 Postprocedural hypothyroidism: Secondary | ICD-10-CM | POA: Diagnosis not present

## 2015-05-03 DIAGNOSIS — C73 Malignant neoplasm of thyroid gland: Secondary | ICD-10-CM

## 2015-05-03 LAB — TSH: TSH: 0.2 u[IU]/mL — ABNORMAL LOW (ref 0.35–4.50)

## 2015-05-04 LAB — THYROGLOBULIN LEVEL: Thyroglobulin: 0.8 ng/mL — ABNORMAL LOW (ref 2.8–40.9)

## 2015-05-06 ENCOUNTER — Encounter: Payer: Self-pay | Admitting: Endocrinology

## 2015-05-06 ENCOUNTER — Ambulatory Visit (INDEPENDENT_AMBULATORY_CARE_PROVIDER_SITE_OTHER): Payer: 59 | Admitting: Endocrinology

## 2015-05-06 VITALS — BP 150/90 | HR 77 | Temp 98.3°F | Resp 14 | Ht 70.0 in | Wt 173.0 lb

## 2015-05-06 DIAGNOSIS — E890 Postprocedural hypothyroidism: Secondary | ICD-10-CM | POA: Diagnosis not present

## 2015-05-06 DIAGNOSIS — C73 Malignant neoplasm of thyroid gland: Secondary | ICD-10-CM

## 2015-05-06 DIAGNOSIS — E291 Testicular hypofunction: Secondary | ICD-10-CM

## 2015-05-06 NOTE — Patient Instructions (Signed)
Stop extra 1/2 tab once weekly

## 2015-05-06 NOTE — Progress Notes (Signed)
Patient ID: Cameron Perez, male   DOB: 1966/03/29, 49 y.o.   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 - FOCAL EXTRATHYROID EXTENSION. Margins: Free of tumor.  Lymph - Vascular invasion: Present. 1 positive lymph node  TREATMENT: He had 76 mCi of I-131 for remnant ablation on 03/29/14 His post therapy body scan showed 3 foci of uptake in the neck with possible uptake in one lymph node  Monitoring: Subsequent whole-body scan in 11/15 did not show any activity His thyroglobulin level in 10/15 was relatively high at 3.6, previously 2.9 It is now improved at 0.9 in 05/2015  HYPOTHYROIDISM He has been on levothyroxine which he has been taking regularly in the morning about half hour before breakfast He feels fairly good without any new fatigue or shakiness Not taking any calcium supplements  Currently taking an extra half tablet once a week of his 150 g supplement  TSH is now slightly below normal     Lab Results  Component Value Date   FREET4 0.94 07/05/2014   FREET4 1.63* 03/03/2014   TSH 0.20* 05/03/2015   TSH 0.69 01/07/2015   TSH 3.26 10/01/2014        Medication List       This list is accurate as of: 05/06/15  1:09 PM.  Always use your most recent med list.               B-complex with vitamin C tablet  Take 1 tablet by mouth daily.     Chromium 500 MCG Tabs  Take 1 tablet by mouth daily.     CIALIS 20 MG tablet  Generic drug:  tadalafil     clomiPHENE 50 MG tablet  Commonly known as:  CLOMID  Take 50 mg by mouth daily. Takes 1/2 tablet daily     L-Arginine 500 MG Caps  Take 1 capsule by mouth daily.     levothyroxine 150 MCG tablet  Commonly known as:  SYNTHROID, LEVOTHROID   Take 150 mcg by mouth daily before breakfast. DAW     levothyroxine 150 MCG tablet  Commonly known as:  SYNTHROID, LEVOTHROID  TAKE 1 TABLET DAILY BEFORE BREAKFAST     meloxicam 15 MG tablet  Commonly known as:  MOBIC  Take 1 tablet (15 mg total) by mouth daily.     multivitamin tablet  Take 1 tablet by mouth daily.     OVER THE COUNTER MEDICATION  Take 2 tablets by mouth daily. andro 400        Allergies: No Known Allergies  Past Medical History  Diagnosis Date  . Thyroid disease   . History of kidney stones 2012  . Thyroid cancer 2015    Past Surgical History  Procedure Laterality Date  . Lithotripsy  2011  . Tibia fracture surgery Left     leg  . Thyroidectomy N/A 02/09/2014    Procedure: THYROIDECTOMY;  Surgeon: [name], MD;  Location: [facility];  Service: General;  Laterality: N/A;    Family History  Problem Relation Age of Onset  . Breast cancer Mother   . Breast cancer Maternal Grandmother   . Diabetes Paternal Grandfather   . Colon cancer Neg Hx     Social History:  reports that he quit smoking about 11 years ago. His smoking use included Cigarettes. He quit smokeless tobacco use about 8 years ago. His smokeless tobacco use included Chew. He reports that he drinks about 0.6 oz of alcohol per week. He reports that he does not use illicit drugs.   Review of Systems:  HYPOGONADISM: This has been diagnosed and treated by a urologist locally.  Not clear what his diagnosis is and he does not know if he has had a prolactin tested No records are available He thinks his testosterone levels are maintained with Clomid half tablet daily and testosterone levels are being monitored by urologist    EXAM:  BP 150/90 mmHg  Pulse 77  Temp(Src) 98.3 F (36.8 C)  Resp 14  Ht 5\' 10"  (1.778 m)  Wt 173 lb (78.472 kg)  BMI 24.82 kg/m2  SpO2 96%  Neck exam shows no palpable abnormality in the thyroid bed or lymphadenopathy         Assessment/Plan:   Hypothyroidism:  His TSH is quite normal and now slightly less than normal with current regimen of 150 mcg , 7-1/2 tablets per week He is compliant with this regimen He feels fairly good He will stop taking the extra half tablet weekly   Thyroid cancer: History of 1.7 cm papillary thyroid carcinoma  His thyroglobulin is now below 1.0 and improved from previous level of 3.6 Will check this again later this year and consider either Thyrogen stimulated thyroglobulin or a scan if the level is higher Plan discussed with patient   [facility] 05/06/2015

## 2015-05-18 ENCOUNTER — Encounter: Payer: Self-pay | Admitting: Endocrinology

## 2015-05-19 ENCOUNTER — Other Ambulatory Visit: Payer: Self-pay | Admitting: *Deleted

## 2015-05-19 MED ORDER — LEVOTHYROXINE SODIUM 150 MCG PO TABS: 150.0000 ug | ORAL_TABLET | Freq: Every day | ORAL | Status: DC

## 2015-08-02 ENCOUNTER — Other Ambulatory Visit: Payer: Self-pay | Admitting: *Deleted

## 2015-08-02 MED ORDER — LEVOTHYROXINE SODIUM 150 MCG PO TABS: 150.0000 ug | ORAL_TABLET | Freq: Every day | ORAL | Status: DC

## 2015-10-31 ENCOUNTER — Other Ambulatory Visit (INDEPENDENT_AMBULATORY_CARE_PROVIDER_SITE_OTHER): Payer: 59

## 2015-10-31 DIAGNOSIS — E890 Postprocedural hypothyroidism: Secondary | ICD-10-CM

## 2015-10-31 DIAGNOSIS — E291 Testicular hypofunction: Secondary | ICD-10-CM

## 2015-10-31 DIAGNOSIS — C73 Malignant neoplasm of thyroid gland: Secondary | ICD-10-CM

## 2015-10-31 LAB — TSH: TSH: 0.43 u[IU]/mL (ref 0.35–4.50)

## 2015-11-04 ENCOUNTER — Ambulatory Visit (INDEPENDENT_AMBULATORY_CARE_PROVIDER_SITE_OTHER): Payer: 59 | Admitting: Endocrinology

## 2015-11-04 ENCOUNTER — Encounter: Payer: Self-pay | Admitting: Endocrinology

## 2015-11-04 VITALS — BP 148/90 | HR 75 | Temp 98.4°F | Resp 14 | Ht 70.0 in | Wt 182.4 lb

## 2015-11-04 DIAGNOSIS — C73 Malignant neoplasm of thyroid gland: Secondary | ICD-10-CM | POA: Diagnosis not present

## 2015-11-04 DIAGNOSIS — E890 Postprocedural hypothyroidism: Secondary | ICD-10-CM | POA: Diagnosis not present

## 2015-11-04 DIAGNOSIS — Z23 Encounter for immunization: Secondary | ICD-10-CM

## 2015-11-04 NOTE — Progress Notes (Signed)
Patient ID: Cameron Perez, male   DOB: 05-13-1966, 49 y.o.   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 - FOCAL EXTRATHYROID EXTENSION. Margins: Free of tumor.  Lymph - Vascular invasion: Present. 1 positive lymph node  TREATMENT: He had 76 mCi of I-131 for remnant ablation on 03/29/14 His post therapy body scan showed 3 foci of uptake in the neck with possible uptake in one lymph node  Monitoring: Subsequent whole-body scan in 11/15 did not show any activity His thyroglobulin level in 10/15 was relatively high at 3.6, previously 2.9 The last level was improved  at 0.9 in 05/2015 and the level is pending from this week  HYPOTHYROIDISM He has been on levothyroxine which he has been taking regularly in the morning about half hour before breakfast Not taking any calcium supplements  For the last month or 2 he has been feeling more tired in the evenings  Currently taking 150 g   TSH is now normal     Lab Results  Component Value Date   FREET4 0.94 07/05/2014   FREET4 1.63* 03/03/2014   TSH 0.43 10/31/2015   TSH 0.20* 05/03/2015   TSH 0.69 01/07/2015        Medication List       This list is accurate as of: 11/04/15  4:51 PM.  Always use your most recent med list.               B-complex with vitamin C tablet  Take 1 tablet by mouth daily.     Chromium 500 MCG Tabs  Take 1 tablet by mouth daily.     CIALIS 20 MG tablet  Generic drug:  tadalafil     clomiPHENE 50 MG tablet  Commonly known as:  CLOMID  Take 50 mg by mouth daily. Takes 1/2 tablet daily     L-Arginine 500 MG Caps  Take 1 capsule by mouth daily.     levothyroxine 150 MCG tablet  Commonly known as:   SYNTHROID, LEVOTHROID  Take 1 tablet (150 mcg total) by mouth daily before breakfast.     multivitamin tablet  Take 1 tablet by mouth daily.     OVER THE COUNTER MEDICATION  Take 2 tablets by mouth daily. andro 400        Allergies: No Known Allergies  Past Medical History  Diagnosis Date  . Thyroid disease   . History of kidney stones 2012  . Thyroid cancer ([place]) 2015    Past Surgical History  Procedure Laterality Date  . Lithotripsy  2011  . Tibia fracture surgery Left     leg  . Thyroidectomy N/A 02/09/2014    Procedure: THYROIDECTOMY;  Surgeon: Cameron Jolly, MD;  Location: [facility];  Service: General;  Laterality: N/A;    Family History  Problem Relation Age of Onset  . Breast cancer Mother   . Breast cancer Maternal Grandmother   . Diabetes Paternal Grandfather   . Colon cancer Neg Hx     Social History:  reports that he quit smoking about 12 years ago. His smoking use included Cigarettes. He quit smokeless tobacco use about 8 years ago. His smokeless tobacco use included Chew. He reports that he drinks  about 0.6 oz of alcohol per week. He reports that he does not use illicit drugs.   Review of Systems:  HYPOGONADISM: This has been diagnosed and treated by a urologist locally.   Not clear what his diagnosis is and no records are available His prolactin level is normal He has been on treatment on and off for about 2 years and more recently is taking a half tablet but has not had a level done since last year.  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  Hypertension: He says his blood pressure is normal at home and also with the dentist yesterday.  He will continue monitoring at home but needs to get her PCP if blood pressure consistently high   Cameron Perez 11/04/2015

## 2015-11-04 NOTE — Patient Instructions (Signed)
Get [employer] BP monitor

## 2015-11-06 LAB — THYROGLOBULIN LEVEL

## 2015-11-06 LAB — PROLACTIN: PROLACTIN: 10.6 ng/mL (ref 4.0–15.2)

## 2015-11-07 ENCOUNTER — Ambulatory Visit (INDEPENDENT_AMBULATORY_CARE_PROVIDER_SITE_OTHER): Payer: 59 | Admitting: Family Medicine

## 2015-11-07 VITALS — BP 136/90 | HR 73 | Temp 97.6°F | Resp 16 | Ht 70.0 in | Wt 182.0 lb

## 2015-11-07 DIAGNOSIS — I10 Essential (primary) hypertension: Secondary | ICD-10-CM

## 2015-11-07 MED ORDER — HYDROCHLOROTHIAZIDE 25 MG PO TABS: 25.0000 mg | ORAL_TABLET | Freq: Every day | ORAL | Status: DC

## 2015-11-07 NOTE — Patient Instructions (Signed)
Managing Your High Blood Pressure Blood pressure is a measurement of how forceful your blood is pressing against the walls of the arteries. Arteries are muscular tubes within the circulatory system. Blood pressure does not stay the same. Blood pressure rises when you are active, excited, or nervous; and it lowers during sleep and relaxation. If the numbers measuring your blood pressure stay above normal most of the time, you are at risk for health problems. High blood pressure (hypertension) is a long-term (chronic) condition in which blood pressure is elevated. A blood pressure reading is recorded as two numbers, such as 120 over 80 (or 120/80). The first, higher number is called the systolic pressure. It is a measure of the pressure in your arteries as the heart beats. The second, lower number is called the diastolic pressure. It is a measure of the pressure in your arteries as the heart relaxes between beats.  Keeping your blood pressure in a normal range is important to your overall health and prevention of health problems, such as heart disease and stroke. When your blood pressure is uncontrolled, your heart has to work harder than normal. High blood pressure is a very common condition in adults because blood pressure tends to rise with age. Men and women are equally likely to have hypertension but at different times in life. Before age 45, men are more likely to have hypertension. After 50 years of age, women are more likely to have it. Hypertension is especially common in African Americans. This condition often has no signs or symptoms. The cause of the condition is usually not known. Your caregiver can help you come up with a plan to keep your blood pressure in a normal, healthy range. BLOOD PRESSURE STAGES Blood pressure is classified into four stages: normal, prehypertension, stage 1, and stage 2. Your blood pressure reading will be used to determine what type of treatment, if any, is necessary.  Appropriate treatment options are tied to these four stages:  Normal  Systolic pressure (mm Hg): below 120.  Diastolic pressure (mm Hg): below 80. Prehypertension  Systolic pressure (mm Hg): 120 to 139.  Diastolic pressure (mm Hg): 80 to 89. Stage1  Systolic pressure (mm Hg): 140 to 159.  Diastolic pressure (mm Hg): 90 to 99. Stage2  Systolic pressure (mm Hg): 160 or above.  Diastolic pressure (mm Hg): 100 or above. RISKS RELATED TO HIGH BLOOD PRESSURE Managing your blood pressure is an important responsibility. Uncontrolled high blood pressure can lead to:  A heart attack.  A stroke.  A weakened blood vessel (aneurysm).  Heart failure.  Kidney damage.  Eye damage.  Metabolic syndrome.  Memory and concentration problems. HOW TO MANAGE YOUR BLOOD PRESSURE Blood pressure can be managed effectively with lifestyle changes and medicines (if needed). Your caregiver will help you come up with a plan to bring your blood pressure within a normal range. Your plan should include the following: Education  Read all information provided by your caregivers about how to control blood pressure.  Educate yourself on the latest guidelines and treatment recommendations. New research is always being done to further define the risks and treatments for high blood pressure. Lifestylechanges  Control your weight.  Avoid smoking.  Stay physically active.  Reduce the amount of salt in your diet.  Reduce stress.  Control any chronic conditions, such as high cholesterol or diabetes.  Reduce your alcohol intake. Medicines  Several medicines (antihypertensive medicines) are available, if needed, to bring blood pressure within a normal range.  Communication  Review all the medicines you take with your caregiver because there may be side effects or interactions.  Talk with your caregiver about your diet, exercise habits, and other lifestyle factors that may be contributing to  high blood pressure.  See your caregiver regularly. Your caregiver can help you create and adjust your plan for managing high blood pressure. RECOMMENDATIONS FOR TREATMENT AND FOLLOW-UP  The following recommendations are based on current guidelines for managing high blood pressure in nonpregnant adults. Use these recommendations to identify the proper follow-up period or treatment option based on your blood pressure reading. You can discuss these options with your caregiver.  Systolic pressure of 295 to 188 or diastolic pressure of 80 to 89: Follow up with your caregiver as directed.  Systolic pressure of 416 to 606 or diastolic pressure of 90 to 100: Follow up with your caregiver within 2 months.  Systolic pressure above 301 or diastolic pressure above 601: Follow up with your caregiver within 1 month.  Systolic pressure above 093 or diastolic pressure above 235: Consider antihypertensive therapy; follow up with your caregiver within 1 week.  Systolic pressure above 573 or diastolic pressure above 220: Begin antihypertensive therapy; follow up with your caregiver within 1 week.   This information is not intended to replace advice given to you by your health care provider. Make sure you discuss any questions you have with your health care provider.   Document Released: 09/10/2012 Document Reviewed: 09/10/2012 Elsevier Interactive Patient Education 2016 [employer].  Hypertension Hypertension, commonly called high blood pressure, is when the force of blood pumping through your arteries is too strong. Your arteries are the blood vessels that carry blood from your heart throughout your body. A blood pressure reading consists of a higher number over a lower number, such as 110/72. The higher number (systolic) is the pressure inside your arteries when your heart pumps. The lower number (diastolic) is the pressure inside your arteries when your heart relaxes. Ideally you want your blood pressure  below 120/80. Hypertension forces your heart to work harder to pump blood. Your arteries may become narrow or stiff. Having untreated or uncontrolled hypertension can cause heart attack, stroke, kidney disease, and other problems. RISK FACTORS Some risk factors for high blood pressure are controllable. Others are not.  Risk factors you cannot control include:   Race. You may be at higher risk if you are African American.  Age. Risk increases with age.  Gender. Men are at higher risk than women before age 67 years. After age 59, women are at higher risk than men. Risk factors you can control include:  Not getting enough exercise or physical activity.  Being overweight.  Getting too much fat, sugar, calories, or salt in your diet.  Drinking too much alcohol. SIGNS AND SYMPTOMS Hypertension does not usually cause signs or symptoms. Extremely high blood pressure (hypertensive crisis) may cause headache, anxiety, shortness of breath, and nosebleed. DIAGNOSIS To check if you have hypertension, your health care provider will measure your blood pressure while you are seated, with your arm held at the level of your heart. It should be measured at least twice using the same arm. Certain conditions can cause a difference in blood pressure between your right and left arms. A blood pressure reading that is higher than normal on one occasion does not mean that you need treatment. If it is not clear whether you have high blood pressure, you may be asked to return on a different day  to have your blood pressure checked again. Or, you may be asked to monitor your blood pressure at home for 1 or more weeks. TREATMENT Treating high blood pressure includes making lifestyle changes and possibly taking medicine. Living a healthy lifestyle can help lower high blood pressure. You may need to change some of your habits. Lifestyle changes may include:  Following the DASH diet. This diet is high in fruits,  vegetables, and whole grains. It is low in salt, red meat, and added sugars.  Keep your sodium intake below 2,300 mg per day.  Getting at least 30-45 minutes of aerobic exercise at least 4 times per week.  Losing weight if necessary.  Not smoking.  Limiting alcoholic beverages.  Learning ways to reduce stress. Your health care provider may prescribe medicine if lifestyle changes are not enough to get your blood pressure under control, and if one of the following is true:  You are 83-18 years of age and your systolic blood pressure is above 140.  You are 33 years of age or older, and your systolic blood pressure is above 150.  Your diastolic blood pressure is above 90.  You have diabetes, and your systolic blood pressure is over 811 or your diastolic blood pressure is over 90.  You have kidney disease and your blood pressure is above 140/90.  You have heart disease and your blood pressure is above 140/90. Your personal target blood pressure may vary depending on your medical conditions, your age, and other factors. HOME CARE INSTRUCTIONS  Have your blood pressure rechecked as directed by your health care provider.   Take medicines only as directed by your health care provider. Follow the directions carefully. Blood pressure medicines must be taken as prescribed. The medicine does not work as well when you skip doses. Skipping doses also puts you at risk for problems.  Do not smoke.   Monitor your blood pressure at home as directed by your health care provider. SEEK MEDICAL CARE IF:   You think you are having a reaction to medicines taken.  You have recurrent headaches or feel dizzy.  You have swelling in your ankles.  You have trouble with your vision. SEEK IMMEDIATE MEDICAL CARE IF:  You develop a severe headache or confusion.  You have unusual weakness, numbness, or feel faint.  You have severe chest or abdominal pain.  You vomit repeatedly.  You have  trouble breathing. MAKE SURE YOU:   Understand these instructions.  Will watch your condition.  Will get help right away if you are not doing well or get worse.   This information is not intended to replace advice given to you by your health care provider. Make sure you discuss any questions you have with your health care provider.   Document Released: 12/17/2005 Document Revised: 05/03/2015 Document Reviewed: 10/09/2013 Elsevier Interactive Patient Education 2016 [place] DASH stands for "Dietary Approaches to Stop Hypertension." The DASH eating plan is a healthy eating plan that has been shown to reduce high blood pressure (hypertension). Additional health benefits may include reducing the risk of type 2 diabetes mellitus, heart disease, and stroke. The DASH eating plan may also help with weight loss. WHAT DO I NEED TO KNOW ABOUT THE DASH EATING PLAN? For the DASH eating plan, you will follow these general guidelines:  Choose foods with a percent daily value for sodium of less than 5% (as listed on the food label).  Use salt-free seasonings or herbs instead of table  salt or sea salt.  Check with your health care provider or pharmacist before using salt substitutes.  Eat lower-sodium products, often labeled as "lower sodium" or "no salt added."  Eat fresh foods.  Eat more vegetables, fruits, and low-fat dairy products.  Choose whole grains. Look for the word "whole" as the first word in the ingredient list.  Choose fish and skinless chicken or [place] more often than red meat. Limit fish, poultry, and meat to 6 oz (170 g) each day.  Limit sweets, desserts, sugars, and sugary drinks.  Choose heart-healthy fats.  Limit cheese to 1 oz (28 g) per day.  Eat more home-cooked food and less restaurant, buffet, and fast food.  Limit fried foods.  Cook foods using methods other than frying.  Limit canned vegetables. If you do use them, rinse them well to  decrease the sodium.  When eating at a restaurant, ask that your food be prepared with less salt, or no salt if possible. WHAT FOODS CAN I EAT? Seek help from a dietitian for individual calorie needs. Grains Whole grain or whole wheat bread. Brown rice. Whole grain or whole wheat pasta. Quinoa, bulgur, and whole grain cereals. Low-sodium cereals. Corn or whole wheat flour tortillas. Whole grain cornbread. Whole grain crackers. Low-sodium crackers. Vegetables Fresh or frozen vegetables (raw, steamed, roasted, or grilled). Low-sodium or reduced-sodium tomato and vegetable juices. Low-sodium or reduced-sodium tomato sauce and paste. Low-sodium or reduced-sodium canned vegetables.  Fruits All fresh, canned (in natural juice), or frozen fruits. Meat and Other Protein Products Ground beef (85% or leaner), grass-fed beef, or beef trimmed of fat. Skinless chicken or [place]. Ground chicken or [place]. Pork trimmed of fat. All fish and seafood. Eggs. Dried beans, peas, or lentils. Unsalted nuts and seeds. Unsalted canned beans. Dairy Low-fat dairy products, such as skim or 1% milk, 2% or reduced-fat cheeses, low-fat ricotta or cottage cheese, or plain low-fat yogurt. Low-sodium or reduced-sodium cheeses. Fats and Oils Tub margarines without trans fats. Light or reduced-fat mayonnaise and salad dressings (reduced sodium). Avocado. Safflower, olive, or canola oils. Natural peanut or almond butter. Other Unsalted popcorn and pretzels. The items listed above may not be a complete list of recommended foods or beverages. Contact your dietitian for more options. WHAT FOODS ARE NOT RECOMMENDED? Grains White bread. White pasta. White rice. Refined cornbread. Bagels and croissants. Crackers that contain trans fat. Vegetables Creamed or fried vegetables. Vegetables in a cheese sauce. Regular canned vegetables. Regular canned tomato sauce and paste. Regular tomato and vegetable juices. Fruits Dried fruits. Canned  fruit in light or heavy syrup. Fruit juice. Meat and Other Protein Products Fatty cuts of meat. Ribs, chicken wings, bacon, sausage, bologna, salami, chitterlings, fatback, hot dogs, bratwurst, and packaged luncheon meats. Salted nuts and seeds. Canned beans with salt. Dairy Whole or 2% milk, cream, half-and-half, and cream cheese. Whole-fat or sweetened yogurt. Full-fat cheeses or blue cheese. Nondairy creamers and whipped toppings. Processed cheese, cheese spreads, or cheese curds. Condiments Onion and garlic salt, seasoned salt, table salt, and sea salt. Canned and packaged gravies. Worcestershire sauce. Tartar sauce. Barbecue sauce. Teriyaki sauce. Soy sauce, including reduced sodium. Steak sauce. Fish sauce. Oyster sauce. Cocktail sauce. Horseradish. Ketchup and mustard. Meat flavorings and tenderizers. Bouillon cubes. Hot sauce. Tabasco sauce. Marinades. Taco seasonings. Relishes. Fats and Oils Butter, stick margarine, lard, shortening, ghee, and bacon fat. Coconut, palm kernel, or palm oils. Regular salad dressings. Other Pickles and olives. Salted popcorn and pretzels. The items listed above may not be  a complete list of foods and beverages to avoid. Contact your dietitian for more information. WHERE CAN I FIND MORE INFORMATION? National Heart, Lung, and Blood Institute: travelstabloid.com   This information is not intended to replace advice given to you by your health care provider. Make sure you discuss any questions you have with your health care provider.   Document Released: 12/06/2011 Document Revised: 01/07/2015 Document Reviewed: 10/21/2013 Elsevier Interactive Patient Education [employer].

## 2015-11-07 NOTE — Progress Notes (Signed)
Subjective:  This chart was scribed for [name], MD by Thea Alken, ED Scribe. This patient was seen in room 5 and the patient's care was started at 5:08 PM.   Patient ID: Cameron Perez, male    DOB: May 24, 1966, 49 y.o.   MRN: [id number]  HPI  Chief Complaint  Patient presents with  . Elevated BP    Visit with Endo 4 days ago   HPI Comments: Cameron Perez is a 49 y.o. male who presents to the [facility] c/o elevated BP . Had thyroidectomy for papillary thyroid carcinoma in 01/2014. He is followed by Dr. Dwyane Dee. At last visit BP was 148/90. Monitors BP at home and reported it had been normal.  He has a cuff at home with home readings averaging 150/100. No hx of elevated BP prior. He is asymptomatic, denies HA, vision changes, leg swelling, syncope, CP, chest tightness and urinary symptoms. Pt wakes up 0-1 times during the night to urinate. He exercises about 4 times a week with strenuous workouts including cross fit training. He does not pay attention to salt in his diet.    Past Medical History  Diagnosis Date  . Thyroid disease   . History of kidney stones 2012  . Thyroid cancer ([place]) 2015   No Known Allergies Prior to Admission medications   Medication Sig Start Date End Date Taking? Authorizing Provider  CIALIS 20 MG tablet  12/17/14  Yes Historical Provider, MD  clomiPHENE (CLOMID) 50 MG tablet Take 50 mg by mouth daily. Takes 1/2 tablet daily 01/01/14  Yes Historical Provider, MD  levothyroxine (SYNTHROID, LEVOTHROID) 150 MCG tablet Take 1 tablet (150 mcg total) by mouth daily before breakfast. 08/02/15  Yes [name], MD  vitamin B-12 (CYANOCOBALAMIN) 500 MCG tablet Take 500 mcg by mouth daily.   Yes Historical Provider, MD    Review of Systems  Constitutional: Negative for fatigue and unexpected weight change.  Eyes: Negative for visual disturbance.  Respiratory: Negative for cough, chest tightness and shortness of breath.   Cardiovascular: Negative  for chest pain, palpitations and leg swelling.  Genitourinary: Negative for frequency.  Neurological: Negative for dizziness, syncope, light-headedness and headaches.   Objective:   Physical Exam  Constitutional: He is oriented to person, place, and time. He appears well-developed and well-nourished. No distress.  HENT:  Head: Normocephalic and atraumatic.  Eyes: Conjunctivae and EOM are normal.  Neck: Neck supple.  Cardiovascular: Normal rate, regular rhythm, S1 normal, S2 normal and normal heart sounds.  Exam reveals no gallop.   No murmur heard. Pulmonary/Chest: Effort normal and breath sounds normal. He has no wheezes. He has no rales.  Musculoskeletal: Normal range of motion.  Lymphadenopathy:    He has no cervical adenopathy.  Neurological: He is alert and oriented to person, place, and time.  Skin: Skin is warm and dry.  Psychiatric: He has a normal mood and affect. His behavior is normal.  Nursing note and vitals reviewed.  Filed Vitals:   11/07/15 1649  BP: 136/90  Pulse: 73  Temp: 97.6 F (36.4 C)  TempSrc: Oral  Resp: 16  Height: 5\' 10"  (1.778 m)  Weight: 182 lb (82.555 kg)  SpO2: 98%    Assessment & Plan:   1. Essential hypertension, benign    Pt has been advised to start watching the amount of salt consumed in his diet, less than 2 grams difficult but ideal - start reading labels and check the drink supplements he is taking  pre- and post-w/o. No contraind to the high protein diet he is following with Crossfit. Check out DASH diet Start HCTZ. Take with breakfast 30 minutes after taking Levothyroxine. Increase H2O and K in diet. Check BP at home 3x/wk - reviewed instructions for accurate measurement. Call if not at goal or any side effects as we may be able to change med to lisinopril and then recheck in office in 1-2 wks after change. Advised to make an appointment for a CPE within 1-2 months for full labs and baseline EKG.  Orders Placed This Encounter    Procedures  . Basic metabolic panel    Order Specific Question:  Has the patient fasted?    Answer:  No    Meds ordered this encounter  Medications  . vitamin B-12 (CYANOCOBALAMIN) 500 MCG tablet    Sig: Take 500 mcg by mouth daily.  . hydrochlorothiazide (HYDRODIURIL) 25 MG tablet    Sig: Take 1 tablet (25 mg total) by mouth daily.    Dispense:  30 tablet    Refill:  2     I personally performed the services described in this documentation, which was scribed in my presence. The recorded information has been reviewed and considered, and addended by me as needed.  [name], MD MPH

## 2015-11-08 LAB — BASIC METABOLIC PANEL
BUN: 25 mg/dL (ref 7–25)
CALCIUM: 8.4 mg/dL — AB (ref 8.6–10.3)
CHLORIDE: 102 mmol/L (ref 98–110)
CO2: 26 mmol/L (ref 20–31)
CREATININE: 1.04 mg/dL (ref 0.70–1.33)
Glucose, Bld: 93 mg/dL (ref 65–99)
Potassium: 4 mmol/L (ref 3.5–5.3)
Sodium: 138 mmol/L (ref 135–146)

## 2015-12-11 ENCOUNTER — Other Ambulatory Visit: Payer: Self-pay | Admitting: Endocrinology

## 2016-01-07 ENCOUNTER — Other Ambulatory Visit: Payer: Self-pay | Admitting: Family Medicine

## 2016-01-16 ENCOUNTER — Encounter: Payer: 59 | Admitting: Family Medicine

## 2016-01-18 ENCOUNTER — Other Ambulatory Visit: Payer: Self-pay | Admitting: Urology

## 2016-02-08 ENCOUNTER — Ambulatory Visit (INDEPENDENT_AMBULATORY_CARE_PROVIDER_SITE_OTHER): Payer: 59 | Admitting: Family Medicine

## 2016-02-08 ENCOUNTER — Encounter: Payer: Self-pay | Admitting: Family Medicine

## 2016-02-08 VITALS — BP 130/86 | HR 72 | Temp 97.7°F | Resp 16 | Ht 69.5 in | Wt 186.2 lb

## 2016-02-08 DIAGNOSIS — I10 Essential (primary) hypertension: Secondary | ICD-10-CM

## 2016-02-08 DIAGNOSIS — Z13220 Encounter for screening for lipoid disorders: Secondary | ICD-10-CM

## 2016-02-08 DIAGNOSIS — Z0000 Encounter for general adult medical examination without abnormal findings: Secondary | ICD-10-CM | POA: Diagnosis not present

## 2016-02-08 DIAGNOSIS — M778 Other enthesopathies, not elsewhere classified: Secondary | ICD-10-CM

## 2016-02-08 DIAGNOSIS — M7581 Other shoulder lesions, right shoulder: Secondary | ICD-10-CM

## 2016-02-08 DIAGNOSIS — E876 Hypokalemia: Secondary | ICD-10-CM

## 2016-02-08 LAB — COMPLETE METABOLIC PANEL WITH GFR
ALT: 26 U/L (ref 9–46)
AST: 22 U/L (ref 10–35)
Albumin: 3.7 g/dL (ref 3.6–5.1)
Alkaline Phosphatase: 71 U/L (ref 40–115)
BUN: 18 mg/dL (ref 7–25)
CHLORIDE: 104 mmol/L (ref 98–110)
CO2: 27 mmol/L (ref 20–31)
Calcium: 8.6 mg/dL (ref 8.6–10.3)
Creat: 0.88 mg/dL (ref 0.70–1.33)
GLUCOSE: 87 mg/dL (ref 65–99)
Potassium: 3 mmol/L — ABNORMAL LOW (ref 3.5–5.3)
Sodium: 140 mmol/L (ref 135–146)
TOTAL PROTEIN: 6.2 g/dL (ref 6.1–8.1)
Total Bilirubin: 0.4 mg/dL (ref 0.2–1.2)

## 2016-02-08 LAB — LIPID PANEL
Cholesterol: 135 mg/dL (ref 125–200)
HDL: 37 mg/dL — AB (ref >=40)
LDL CALC: 86 mg/dL (ref <130)
TRIGLYCERIDES: 59 mg/dL (ref <150)
Total CHOL/HDL Ratio: 3.6 Ratio (ref <=5.0)
VLDL: 12 mg/dL (ref <30)

## 2016-02-08 MED ORDER — HYDROCHLOROTHIAZIDE 25 MG PO TABS: 25.0000 mg | ORAL_TABLET | Freq: Every day | ORAL | Status: DC

## 2016-02-08 NOTE — Progress Notes (Signed)
Subjective:    Patient ID: Cameron Perez, male    DOB: 1966/10/20, 50 y.o.   MRN: [id number] By signing my name below, I, Zola Button, attest that this documentation has been prepared under the direction and in the presence of [name], MD.  Electronically Signed: Zola Button, Medical Scribe. 02/08/2016. 1:41 PM.  HPI HPI Comments: Cameron Perez is a 50 y.o. male who presents to the [facility] for a complete physical exam. Primarily followed by Dr. 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 History of thyroid cancer in February 2015 with ablation March 2015.   Hypertension: Was noted to be borderline hypertensive at last visit in November. Seen by Dr. Brigitte Pulse November 7th. Recommended DASH diet. Start HCTZ 25 mg qd. Patient states he has been compliant with his medications, however he did forget to take the HCTZ this morning as he was fasting. His blood pressure has been around 120/80 outside of the office. Patient denies any side effects, including urinary frequency, lightheadedness and dizziness.  Lab Results  Component Value Date   CREATININE 1.04 11/07/2015    Cancer screening: Colon cancer screening - Last colonoscopy in September 2015. Diverticulosis was noted, otherwise normal. Repeat 10 years. Prostate cancer screening - Patient notes he had an elevated PSA in 2015, then he had a biopsy. The following PSA was normal last June. He sees [facility] every 6 months.  Immunizations: His last tetanus was in 2011. Immunization History  Administered Date(s) Administered  . Influenza,inj,Quad PF,36+ Mos 01/12/2015, 11/04/2015  . Influenza-Unspecified 11/04/2015  . Tdap 12/31/2009    Depression screening: Patient denies depression symptoms.  Depression screen [facility] 2/9 02/08/2016 11/07/2015  Decreased Interest 0 0  Down, Depressed, Hopeless 0 0  PHQ - 2 Score 0 0   Vision: He will  see his eye doctor next week.  Dentist: He has cleanings every 4 months.   Exercise: He does Crossfit 5 times a week.  STD screening: Patient is married and denies intercourse outside of marriage. He declines STD screening. He does not have any relationship concerns. Patient has Cialis as needed; he denies any side effects. He believes he had HIV testing long ago.  Hypothyroidism: Followed by Dr. Dwyane Dee. Last TSH normal in October 2016. Next appointment with Dr. Dwyane Dee scheduled in July.  Right deltoid pain: Patient states he has had intermittent right deltoid pain for the past 2-3 months. He did go through physical therapy and had dry needling done about a month ago. Since then, the pain has been improving. Patient denies injury. He attributes the pain to overuse as he does Crossfit. The pain has limited some of his exercises and the amount of weight he can lift.  Patient works at [employer].  Patient Active Problem List   Diagnosis Date Noted  . Special screening for malignant neoplasms, colon 09/21/2014  . 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 Thyroid cancer ([place]) 02/09/2014  . Papillary adenocarcinoma of thyroid ([place]) 01/20/2014   Past Medical History  Diagnosis Date  . Thyroid disease   . History of kidney stones 2012  . Thyroid cancer ([place]) 2015  . Kidney stones    Past Surgical History  Procedure Laterality Date  . Lithotripsy  2011  . Tibia fracture surgery Left     leg  . Thyroidectomy N/A 02/09/2014    Procedure: THYROIDECTOMY;  Surgeon: [name], MD;  Location: [facility];  Service: General;  Laterality: N/A;  .  Vasectomy     No Known Allergies Prior to Admission medications   Medication Sig Start Date End Date Taking? Authorizing Provider  CIALIS 20 MG tablet  12/17/14   Historical Provider, MD  clomiPHENE (CLOMID) 50 MG tablet Take 50 mg by mouth daily. Takes 1/2 tablet daily 01/01/14   Historical Provider, MD  hydrochlorothiazide (HYDRODIURIL) 25 MG tablet TAKE  1 TABLET (25 MG TOTAL) BY MOUTH DAILY. 01/09/16   Harrison Mons, PA-C  levothyroxine (SYNTHROID, LEVOTHROID) 150 MCG tablet Take 1 tablet by mouth  daily before breakfast 12/11/15   [name], MD  vitamin B-12 (CYANOCOBALAMIN) 500 MCG tablet Take 500 mcg by mouth daily.    Historical Provider, MD   Social History   Social History  . Marital Status: Married    Spouse Name: N/A  . Number of Children: 5  . Years of Education: N/A   Occupational History  . 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 Smoking status: Former Smoker    Types: Cigarettes    Quit date: 06/30/2003  . Smokeless tobacco: Former Systems developer    Types: Chew    Quit date: 12/31/2006  . Alcohol Use: 0.6 oz/week    1 Cans of beer per week     Comment: weekly  . Drug Use: No     Comment: CHEWS NICOTINE GUM  . Sexual Activity: Yes   Other Topics Concern  . Not on file   Social History Narrative   Married   Education: College   Exercise: Yes     Review of Systems  Musculoskeletal: Positive for myalgias and arthralgias.  All other systems reviewed and are negative.  13 point ROS reviewed on patient health survey. Negative other than listed above or in nursing note. See nursing note.     Objective:   Physical Exam  Constitutional: He is oriented to person, place, and time. He appears well-developed and well-nourished.  HENT:  Head: Normocephalic and atraumatic.  Right Ear: External ear normal.  Left Ear: External ear normal.  Mouth/Throat: Oropharynx is clear and moist.  Eyes: Conjunctivae and EOM are normal. Pupils are equal, round, and reactive to light.  Neck: Normal range of motion. Neck supple. No thyromegaly present.  C-spine non-tender. Full ROM, does not reproduce shoulder pain.  Cardiovascular: Normal rate, regular rhythm, normal heart sounds and intact distal pulses.   Pulmonary/Chest: Effort normal and breath sounds normal. No respiratory distress. He has no wheezes.    Abdominal: Soft. He exhibits no distension. There is no tenderness.  Musculoskeletal: Normal range of motion. He exhibits no edema or tenderness.  Right shoulder, AC, and clavicle non-tender. Equal deltoid strength. Full RTC strength. Very minimal decreased rotation, right greater than left. Tenderness over right distal anterior deltoid, no defect.  Lymphadenopathy:    He has no cervical adenopathy.  Neurological: He is alert and oriented to person, place, and time. He has normal reflexes.  Skin: Skin is warm and dry.  Psychiatric: He has a normal mood and affect. His behavior is normal.  Vitals reviewed.   Filed Vitals:   02/08/16 1325  BP: 130/86  Pulse: 72  Temp: 97.7 F (36.5 C)  TempSrc: Oral  Resp: 16  Height: 5' 9.5" (1.765 m)  Weight: 186 lb 3.2 oz (84.46 kg)  SpO2: 98%         Assessment & Plan:   Cameron Perez is a 50 y.o. male Annual physical exam  --anticipatory guidance as below  in AVS, screening labs above. Health maintenance items as above in HPI discussed/recommended as applicable.   Screening for hyperlipidemia - Plan: Lipid panel  Deltoid tendinitis of right shoulder  -Improved with prior physical therapy treatment, small area of residual discomfort, may need further physical therapy, eccentric strengthening. Discussed avoidance of offending activities and overhead work with [employer], will set up appointment again with prior physical therapist. Advised let me know if referral needed. If symptoms persist, consider orthopedic eval.  Essential hypertension - Plan: COMPLETE METABOLIC PANEL WITH GFR  -Stable on new dosing of HCTZ. CMP pending.  Meds ordered this encounter  Medications  . hydrochlorothiazide (HYDRODIURIL) 25 MG tablet    Sig: Take 1 tablet (25 mg total) by mouth daily.    Dispense:  90 tablet    Refill:  1    CYCLE FILL MEDICATION. Authorization is required for next refill.   Patient Instructions  Follow up with physical therapist for  suspected deltoid tendinitis. Let me know if this is not improving in next 4-6 weeks and can refer you to ortho.   You should receive a call or letter about your lab results within the next week to 10 days.  Keeping you healthy  Get these tests  Blood pressure- Have your blood pressure checked once a year by your healthcare provider.  Normal blood pressure is 120/80  Weight- Have your body mass index (BMI) calculated to screen for obesity.  BMI is a measure of body fat based on height and weight. You can also calculate your own BMI at [email or web address].  Cholesterol- Have your cholesterol checked every year.  Diabetes- Have your blood sugar checked regularly if you have high blood pressure, high cholesterol, have a family history of diabetes or if you are overweight.  Screening for Colon Cancer- Colonoscopy starting at age 33.  Screening may begin sooner depending on your family history and other health conditions. Follow up colonoscopy as directed by your Gastroenterologist.  Screening for Prostate Cancer- Both blood work (PSA) and a rectal exam help screen for Prostate Cancer.  Screening begins at age 52 with African-American men and at age 55 with Caucasian men.  Screening may begin sooner depending on your family history.  Take these medicines  Aspirin- One aspirin daily can help prevent Heart disease and Stroke.  Flu shot- Every fall.  Tetanus- Every 10 years.  Zostavax- Once after the age of 55 to prevent Shingles.  Pneumonia shot- Once after the age of 87; if you are younger than 19, ask your healthcare provider if you need a Pneumonia shot.  Take these steps  Don't smoke- If you do smoke, talk to your doctor about quitting.  For tips on how to quit, go to www.smokefree.gov or call 1-800-QUIT-NOW.  Be physically active- Exercise 5 days a week for at least 30 minutes.  If you are not already physically active start slow and gradually work up to 30 minutes of moderate  physical activity.  Examples of moderate activity include walking briskly, mowing the yard, dancing, swimming, bicycling, etc.  Eat a healthy diet- Eat a variety of healthy food such as fruits, vegetables, low fat milk, low fat cheese, yogurt, lean meant, poultry, fish, beans, tofu, etc. For more information go to www.thenutritionsource.org  Drink alcohol in moderation- Limit alcohol intake to less than two drinks a day. Never drink and drive.  Dentist- Brush and floss twice daily; visit your dentist twice a year.  Depression- Your emotional health is as important as your  physical health. If you're feeling down, or losing interest in things you would normally enjoy please talk to your healthcare provider.  Eye exam- Visit your eye doctor every year.  Safe sex- If you may be exposed to a sexually transmitted infection, use a condom.  Seat belts- Seat belts can save your life; always wear one.  Smoke/Carbon Monoxide detectors- These detectors need to be installed on the appropriate level of your home.  Replace batteries at least once a year.  Skin cancer- When out in the sun, cover up and use sunscreen 15 SPF or higher.  Violence- If anyone is threatening you, please tell your healthcare provider.  Living Will/ Health care power of attorney- Speak with your healthcare provider and family.    I personally performed the services described in this documentation, which was scribed in my presence. The recorded information has been reviewed and considered, and addended by me as needed.

## 2016-02-08 NOTE — Patient Instructions (Signed)
Follow up with physical therapist for suspected deltoid tendinitis. Let me know if this is not improving in next 4-6 weeks and can refer you to ortho.   You should receive a call or letter about your lab results within the next week to 10 days.  Keeping you healthy  Get these tests  Blood pressure- Have your blood pressure checked once a year by your healthcare provider.  Normal blood pressure is 120/80  Weight- Have your body mass index (BMI) calculated to screen for obesity.  BMI is a measure of body fat based on height and weight. You can also calculate your own BMI at [email or web address].  Cholesterol- Have your cholesterol checked every year.  Diabetes- Have your blood sugar checked regularly if you have high blood pressure, high cholesterol, have a family history of diabetes or if you are overweight.  Screening for Colon Cancer- Colonoscopy starting at age 71.  Screening may begin sooner depending on your family history and other health conditions. Follow up colonoscopy as directed by your Gastroenterologist.  Screening for Prostate Cancer- Both blood work (PSA) and a rectal exam help screen for Prostate Cancer.  Screening begins at age 65 with African-American men and at age 64 with Caucasian men.  Screening may begin sooner depending on your family history.  Take these medicines  Aspirin- One aspirin daily can help prevent Heart disease and Stroke.  Flu shot- Every fall.  Tetanus- Every 10 years.  Zostavax- Once after the age of 67 to prevent Shingles.  Pneumonia shot- Once after the age of 46; if you are younger than 93, ask your healthcare provider if you need a Pneumonia shot.  Take these steps  Don't smoke- If you do smoke, talk to your doctor about quitting.  For tips on how to quit, go to www.smokefree.gov or call 1-800-QUIT-NOW.  Be physically active- Exercise 5 days a week for at least 30 minutes.  If you are not already physically active start slow and  gradually work up to 30 minutes of moderate physical activity.  Examples of moderate activity include walking briskly, mowing the yard, dancing, swimming, bicycling, etc.  Eat a healthy diet- Eat a variety of healthy food such as fruits, vegetables, low fat milk, low fat cheese, yogurt, lean meant, poultry, fish, beans, tofu, etc. For more information go to www.thenutritionsource.org  Drink alcohol in moderation- Limit alcohol intake to less than two drinks a day. Never drink and drive.  Dentist- Brush and floss twice daily; visit your dentist twice a year.  Depression- Your emotional health is as important as your physical health. If you're feeling down, or losing interest in things you would normally enjoy please talk to your healthcare provider.  Eye exam- Visit your eye doctor every year.  Safe sex- If you may be exposed to a sexually transmitted infection, use a condom.  Seat belts- Seat belts can save your life; always wear one.  Smoke/Carbon Monoxide detectors- These detectors need to be installed on the appropriate level of your home.  Replace batteries at least once a year.  Skin cancer- When out in the sun, cover up and use sunscreen 15 SPF or higher.  Violence- If anyone is threatening you, please tell your healthcare provider.  Living Will/ Health care power of attorney- Speak with your healthcare provider and family.

## 2016-02-08 NOTE — Progress Notes (Signed)
   Subjective:    Patient ID: Cameron Perez, male    DOB: [birth year], 50 y.o.   MRN: [id number]  HPI    Review of Systems  Constitutional: Negative.   HENT: Negative.   Eyes: Negative.   Respiratory: Negative.   Cardiovascular: Negative.   Gastrointestinal: Negative.   Endocrine: Negative.   Genitourinary: Negative.   Musculoskeletal: Positive for myalgias and arthralgias.  Skin: Negative.   Allergic/Immunologic: Negative.   Neurological: Negative.   Hematological: Negative.   Psychiatric/Behavioral: Negative.        Objective:   Physical Exam        Assessment & Plan:

## 2016-02-23 ENCOUNTER — Other Ambulatory Visit: Payer: Self-pay | Admitting: Urology

## 2016-02-24 NOTE — Addendum Note (Signed)
Addended by: [name] R on: 02/24/2016 10:19 PM   Modules accepted: Orders

## 2016-02-28 ENCOUNTER — Other Ambulatory Visit (INDEPENDENT_AMBULATORY_CARE_PROVIDER_SITE_OTHER): Payer: 59

## 2016-02-28 ENCOUNTER — Encounter: Payer: Self-pay | Admitting: Family Medicine

## 2016-02-28 DIAGNOSIS — E876 Hypokalemia: Secondary | ICD-10-CM | POA: Diagnosis not present

## 2016-02-28 LAB — BASIC METABOLIC PANEL
BUN: 28 mg/dL — AB (ref 7–25)
CHLORIDE: 102 mmol/L (ref 98–110)
CO2: 30 mmol/L (ref 20–31)
Calcium: 8.4 mg/dL — ABNORMAL LOW (ref 8.6–10.3)
Creat: 1.05 mg/dL (ref 0.70–1.33)
GLUCOSE: 95 mg/dL (ref 65–99)
Potassium: 3.7 mmol/L (ref 3.5–5.3)
Sodium: 140 mmol/L (ref 135–146)

## 2016-03-15 ENCOUNTER — Other Ambulatory Visit: Payer: Self-pay

## 2016-03-15 MED ORDER — HYDROCHLOROTHIAZIDE 25 MG PO TABS: 25.0000 mg | ORAL_TABLET | Freq: Every day | ORAL | Status: DC

## 2016-03-23 ENCOUNTER — Ambulatory Visit (INDEPENDENT_AMBULATORY_CARE_PROVIDER_SITE_OTHER): Payer: 59 | Admitting: Emergency Medicine

## 2016-03-23 VITALS — BP 124/66 | HR 96 | Temp 99.8°F | Resp 17 | Ht 69.5 in | Wt 185.0 lb

## 2016-03-23 DIAGNOSIS — R6889 Other general symptoms and signs: Secondary | ICD-10-CM

## 2016-03-23 DIAGNOSIS — R059 Cough, unspecified: Secondary | ICD-10-CM

## 2016-03-23 DIAGNOSIS — R05 Cough: Secondary | ICD-10-CM | POA: Diagnosis not present

## 2016-03-23 DIAGNOSIS — J029 Acute pharyngitis, unspecified: Secondary | ICD-10-CM

## 2016-03-23 LAB — POCT INFLUENZA A/B
INFLUENZA A, POC: NEGATIVE
Influenza B, POC: NEGATIVE

## 2016-03-23 LAB — POCT RAPID STREP A (OFFICE): Rapid Strep A Screen: NEGATIVE

## 2016-03-23 MED ORDER — BENZONATATE 100 MG PO CAPS: 100.0000 mg | ORAL_CAPSULE | Freq: Three times a day (TID) | ORAL | Status: DC | PRN

## 2016-03-23 MED ORDER — OSELTAMIVIR PHOSPHATE 75 MG PO CAPS: 75.0000 mg | ORAL_CAPSULE | Freq: Two times a day (BID) | ORAL | Status: DC

## 2016-03-23 NOTE — Patient Instructions (Addendum)
   IF you received an x-ray today, you will receive an invoice from [facility]. Please contact [facility] at [phone] with questions or concerns regarding your invoice.   IF you received labwork today, you will receive an invoice from [facility]. Please contact [facility] at [phone] with questions or concerns regarding your invoice.   Our billing staff will not be able to assist you with questions regarding bills from these companies.  You will be contacted with the lab results as soon as they are available. The fastest way to get your results is to activate your My Chart account. Instructions are located on the last page of this paperwork. If you have not heard from us regarding the results in 2 weeks, please contact this office.     Influenza, Adult Influenza ("the flu") is a viral infection of the respiratory tract. It occurs more often in winter months because people spend more time in close contact with one another. Influenza can make you feel very sick. Influenza easily spreads from person to person (contagious). CAUSES  Influenza is caused by a virus that infects the respiratory tract. You can catch the virus by breathing in droplets from an infected person's cough or sneeze. You can also catch the virus by touching something that was recently contaminated with the virus and then touching your mouth, nose, or eyes. RISKS AND COMPLICATIONS You may be at risk for a more severe case of influenza if you smoke cigarettes, have diabetes, have chronic heart disease (such as heart failure) or lung disease (such as asthma), or if you have a weakened immune system. Elderly people and pregnant women are also at risk for more serious infections. The most common problem of influenza is a lung infection (pneumonia). Sometimes, this problem can require emergency medical care and may be life threatening. SIGNS AND SYMPTOMS  Symptoms typically last 4  to 10 days and may include:  Fever.  Chills.  Headache, body aches, and muscle aches.  Sore throat.  Chest discomfort and cough.  Poor appetite.  Weakness or feeling tired.  Dizziness.  Nausea or vomiting. DIAGNOSIS  Diagnosis of influenza is often made based on your history and a physical exam. A nose or throat swab test can be done to confirm the diagnosis. TREATMENT  In mild cases, influenza goes away on its own. Treatment is directed at relieving symptoms. For more severe cases, your health care provider may prescribe antiviral medicines to shorten the sickness. Antibiotic medicines are not effective because the infection is caused by a virus, not by bacteria. HOME CARE INSTRUCTIONS  Take medicines only as directed by your health care provider.  Use a cool mist humidifier to make breathing easier.  Get plenty of rest until your temperature returns to normal. This usually takes 3 to 4 days.  Drink enough fluid to keep your urine clear or pale yellow.  Cover yourmouth and nosewhen coughing or sneezing,and wash your handswellto prevent thevirusfrom spreading.  Stay homefromwork orschool untilthe fever is gonefor at least 1full day. PREVENTION  An annual influenza vaccination (flu shot) is the best way to avoid getting influenza. An annual flu shot is now routinely recommended for all adults in the [place] SEEK MEDICAL CARE IF:  You experiencechest pain, yourcough worsens,or you producemore mucus.  Youhave nausea,vomiting, ordiarrhea.  Your fever returns or gets worse. SEEK IMMEDIATE MEDICAL CARE IF:  You havetrouble breathing, you become short of breath,or your skin ornails becomebluish.  You have severe painor   stiffnessin the neck.  You develop a sudden headache, or pain in the face or ear.  You have nausea or vomiting that you cannot control. MAKE SURE YOU:   Understand these instructions.  Will watch your condition.  Will get help  right away if you are not doing well or get worse.   This information is not intended to replace advice given to you by your health care provider. Make sure you discuss any questions you have with your health care provider.   Document Released: 12/14/2000 Document Revised: 01/07/2015 Document Reviewed: 03/17/2012 Elsevier Interactive Patient Education 2016 [employer]  

## 2016-03-23 NOTE — Progress Notes (Addendum)
By signing my name below, I, Raven Small, attest that this documentation has been prepared under the direction and in the presence of Arlyss Queen, MD.  Electronically Signed: Thea Alken, ED Scribe. 03/23/2016. 10:55 AM.  Chief Complaint:  Chief Complaint  Patient presents with  . URI  . Generalized Body Aches  . Cough    HPI: Cameron Perez is a 50 y.o. male who reports to [facility] today complaining of cough. Pt states symptoms started yesterday with body aches and fatigue. He woke up this morning with worsening symptoms of cough, sore throat and HA. Pt denies sick contacts at home and work. He did not get flu shot this year.   Past Medical History  Diagnosis Date  . Thyroid disease   . History of kidney stones 2012  . Thyroid cancer ([place]) 2015  . Kidney stones    Past Surgical History  Procedure Laterality Date  . Lithotripsy  2011  . Tibia fracture surgery Left     leg  . Thyroidectomy N/A 02/09/2014    Procedure: THYROIDECTOMY;  Surgeon: [name], MD;  Location: [facility];  Service: General;  Laterality: N/A;  . Vasectomy     Social History   Social History  . Marital Status: Married    Spouse Name: N/A  . Number of Children: 5  . Years of Education: N/A   Occupational History  . 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 Smoking status: Former Smoker    Types: Cigarettes    Quit date: 06/30/2003  . Smokeless tobacco: Former Systems developer    Types: Chew    Quit date: 12/31/2006  . Alcohol Use: 0.6 oz/week    1 Cans of beer per week     Comment: weekly  . Drug Use: No     Comment: CHEWS NICOTINE GUM  . Sexual Activity: Yes   Other Topics Concern  . None   Social History Narrative   Married   Education: Secretary/administrator   Exercise: Yes   Family History  Problem Relation Age of Onset  . Breast cancer Mother   . Cancer Mother     Breast  . Hypertension Mother   . Breast cancer Maternal Grandmother   . Cancer Maternal Grandmother    . Diabetes Paternal Grandfather   . Hyperlipidemia Paternal Grandfather   . Colon cancer Neg Hx    No Known Allergies Prior to Admission medications   Medication Sig Start Date End Date Taking? Authorizing Provider  Ascorbic Acid (VITAMIN C) 100 MG tablet Take 100 mg by mouth daily.   Yes Historical Provider, MD  CIALIS 20 MG tablet  12/17/14  Yes Historical Provider, MD  clomiPHENE (CLOMID) 50 MG tablet Take 50 mg by mouth daily. Takes 1/2 tablet daily 01/01/14  Yes Historical Provider, MD  hydrochlorothiazide (HYDRODIURIL) 25 MG tablet Take 1 tablet (25 mg total) by mouth daily. 03/15/16  Yes [name], MD  levothyroxine (SYNTHROID, LEVOTHROID) 150 MCG tablet Take 1 tablet by mouth  daily before breakfast 12/11/15  Yes [name], MD  potassium chloride (K-DUR,KLOR-CON) 10 MEQ tablet Take 10 mEq by mouth 2 (two) times daily.   Yes Historical Provider, MD  vitamin B-12 (CYANOCOBALAMIN) 500 MCG tablet Take 500 mcg by mouth daily. Reported on 02/08/2016   Yes Historical Provider, MD     ROS: The patient denies unintentional weight loss, chest pain, palpitations, wheezing, dyspnea on exertion, nausea, vomiting, abdominal pain, dysuria, hematuria,  melena, numbness, weakness, or tingling.   All other systems have been reviewed and were otherwise negative with the exception of those mentioned in the HPI and as above.    PHYSICAL EXAM: Filed Vitals:   03/23/16 1047  BP: 124/66  Pulse: 96  Temp: 99.8 F (37.7 C)  Resp: 17   Body mass index is 26.94 kg/(m^2).   General: Alert, no acute distress HEENT:  Normocephalic, atraumatic, oropharynx patent.  Nasal congestion. Slightly red throat.  Eye: [facility] Cardiovascular:  Regular rate and rhythm, no rubs murmurs or gallops.  No Carotid bruits, radial pulse intact. No pedal edema.  Respiratory: Clear to auscultation bilaterally.  No wheezes, rales, or rhonchi.  No cyanosis, no use of accessory musculature Abdominal: No organomegaly,  abdomen is soft and non-tender, positive bowel sounds.  No masses. Musculoskeletal: Gait intact. No edema, tenderness Skin: No rashes. Neurologic: Facial musculature symmetric. Psychiatric: Patient acts appropriately throughout our interaction. Lymphatic: No cervical or submandibular lymphadenopathy    LABS: Results for orders placed or performed in visit on 03/23/16  POCT Influenza A/B  Result Value Ref Range   Influenza A, POC Negative Negative   Influenza B, POC Negative Negative   Results for orders placed or performed in visit on 03/23/16  POCT Influenza A/B  Result Value Ref Range   Influenza A, POC Negative Negative   Influenza B, POC Negative Negative  POCT rapid strep A  Result Value Ref Range   Rapid Strep A Screen Negative Negative   Meds ordered this encounter  Medications  . Ascorbic Acid (VITAMIN C) 100 MG tablet    Sig: Take 100 mg by mouth daily.  . potassium chloride (K-DUR,KLOR-CON) 10 MEQ tablet    Sig: Take 10 mEq by mouth 2 (two) times daily.  Marland Kitchen oseltamivir (TAMIFLU) 75 MG capsule    Sig: Take 1 capsule (75 mg total) by mouth 2 (two) times daily.    Dispense:  10 capsule    Refill:  0  . benzonatate (TESSALON) 100 MG capsule    Sig: Take 1-2 capsules (100-200 mg total) by mouth 3 (three) times daily as needed for cough.    Dispense:  40 capsule    Refill:  0    ASSESSMENT/PLAN: Patient placed on Tamiflu and Tessalon. Will treat this is influenza recheck 48 hours if not improved.I personally performed the services described in this documentation, which was scribed in my presence. The recorded information has been reviewed and is accurate.   Gross sideeffects, risk and benefits, and alternatives of medications d/w patient. Patient is aware that all medications have potential sideeffects and we are unable to predict every sideeffect or drug-drug interaction that may occur.  Arlyss Queen MD 03/23/2016 10:55 AM

## 2016-05-28 ENCOUNTER — Other Ambulatory Visit: Payer: Self-pay | Admitting: Endocrinology

## 2016-06-12 ENCOUNTER — Encounter: Payer: Self-pay | Admitting: Endocrinology

## 2016-06-29 ENCOUNTER — Other Ambulatory Visit (INDEPENDENT_AMBULATORY_CARE_PROVIDER_SITE_OTHER): Payer: 59

## 2016-06-29 DIAGNOSIS — E890 Postprocedural hypothyroidism: Secondary | ICD-10-CM

## 2016-06-29 DIAGNOSIS — C73 Malignant neoplasm of thyroid gland: Secondary | ICD-10-CM

## 2016-06-29 LAB — TSH: TSH: 0.66 u[IU]/mL (ref 0.35–4.50)

## 2016-07-02 ENCOUNTER — Encounter: Payer: Self-pay | Admitting: Endocrinology

## 2016-07-02 LAB — THYROGLOBULIN BY IMA: Thyroglobulin by IMA: 2.3 ng/mL (ref 1.4–29.2)

## 2016-07-02 LAB — TGAB+THYROGLOBULIN IMA OR LCMS: Thyroglobulin Antibody: <1 IU/mL (ref 0.0–0.9)

## 2016-07-06 ENCOUNTER — Other Ambulatory Visit: Payer: 59

## 2016-07-06 ENCOUNTER — Ambulatory Visit: Payer: 59 | Admitting: Endocrinology

## 2016-07-11 ENCOUNTER — Encounter: Payer: Self-pay | Admitting: Endocrinology

## 2016-07-11 ENCOUNTER — Telehealth: Payer: Self-pay | Admitting: Endocrinology

## 2016-07-11 ENCOUNTER — Ambulatory Visit (INDEPENDENT_AMBULATORY_CARE_PROVIDER_SITE_OTHER): Payer: 59 | Admitting: Endocrinology

## 2016-07-11 ENCOUNTER — Other Ambulatory Visit: Payer: Self-pay | Admitting: Family Medicine

## 2016-07-11 VITALS — BP 118/78 | HR 73 | Ht 69.5 in | Wt 174.0 lb

## 2016-07-11 DIAGNOSIS — C73 Malignant neoplasm of thyroid gland: Secondary | ICD-10-CM

## 2016-07-11 NOTE — Progress Notes (Signed)
Patient ID: Cameron Perez, male   DOB: 1966-02-03, 50 y.o.   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 - FOCAL EXTRATHYROID EXTENSION.  Margins: Free of tumor.  Lymph - Vascular invasion: Present. 1 positive lymph node  TREATMENT: He had 76 mCi of I-131 for remnant ablation on 03/29/14 His post therapy body scan showed 3 foci of uptake in the neck with possible uptake in one lymph node  Monitoring: Subsequent whole-body scan in 11/15 did not show any activity His thyroglobulin level in 10/15 was relatively high at 3.6, previously 2.9 The level was improved  at 0.9 in 05/2015, follow-up level was <2.0 More recently his thyroglobulin is 2.3  HYPOTHYROIDISM He has been on levothyroxine which he has been taking regularly in the morning about half hour before breakfast Not taking any calcium supplement  No complaints of fatigue Currently taking 150 g   TSH is now consistently normal and below 1.0    Lab Results  Component Value Date   FREET4 0.94 07/05/2014   FREET4 1.63* 03/03/2014   TSH 0.66 06/29/2016   TSH 0.43 10/31/2015   TSH 0.20* 05/03/2015        Medication List       This list is accurate as of: 07/11/16  3:01 PM.  Always use your most recent med list.               CIALIS 20 MG tablet  Generic drug:  tadalafil     clomiPHENE 50 MG tablet  Commonly known as:  CLOMID  Take 50 mg by mouth daily. Takes 1/2 tablet daily     hydrochlorothiazide 25 MG tablet  Commonly known as:  HYDRODIURIL  Take 1 tablet (25 mg total) by mouth daily.     levothyroxine 150 MCG tablet  Commonly known as:  SYNTHROID, LEVOTHROID  Take 1 tablet by mouth  daily before breakfast         Allergies: No Known Allergies  Past Medical History  Diagnosis Date  . Thyroid disease   . History of kidney stones 2012  . Thyroid cancer ([place]) 2015  . Kidney stones     Past Surgical History  Procedure Laterality Date  . Lithotripsy  2011  . Tibia fracture surgery Left     leg  . Thyroidectomy N/A 02/09/2014    Procedure: THYROIDECTOMY;  Surgeon: [name], MD;  Location: [facility];  Service: General;  Laterality: N/A;  . Vasectomy      Family History  Problem Relation Age of Onset  . Breast cancer Mother   . Cancer Mother     Breast  . Hypertension Mother   . Breast cancer Maternal Grandmother   . Cancer Maternal Grandmother   . Diabetes Paternal Grandfather   . Hyperlipidemia Paternal Grandfather   . Colon cancer Neg Hx     Social History:  reports that he quit smoking about 13 years ago. His smoking use included Cigarettes. He quit smokeless tobacco use about 9 years ago. His smokeless tobacco use included Chew. He reports that he drinks about 0.6 oz of alcohol per week. He reports that he does not use illicit drugs.   Review of Systems:  HYPOGONADISM: This has  been diagnosed and treated by a urologist locally.   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

## 2016-07-11 NOTE — Telephone Encounter (Signed)
Pt wants [place] to see if the thyrogen injections will be covered by his [facility] insurance before we proceed.

## 2016-07-11 NOTE — Telephone Encounter (Signed)
He will have to call himself

## 2016-07-12 ENCOUNTER — Encounter: Payer: Self-pay | Admitting: Endocrinology

## 2016-07-12 NOTE — Telephone Encounter (Signed)
The number listed on the pt's chart is not a working number.

## 2016-07-13 NOTE — Telephone Encounter (Signed)
Pt is talking to Dr. Dwyane Dee via Deloris Ping about this message.

## 2016-07-16 ENCOUNTER — Encounter (HOSPITAL_COMMUNITY)
Admission: RE | Admit: 2016-07-16 | Discharge: 2016-07-16 | Disposition: A | Discharge status: Home / Self Care | Payer: 59 | Source: Ambulatory Visit | Attending: Endocrinology | Admitting: Endocrinology

## 2016-07-16 DIAGNOSIS — C73 Malignant neoplasm of thyroid gland: Secondary | ICD-10-CM | POA: Diagnosis present

## 2016-07-16 MED ORDER — THYROTROPIN ALFA 1.1 MG IM SOLR
0.9000 mg | INTRAMUSCULAR | Status: AC
  Administered 2016-07-16: 0.9 mg via INTRAMUSCULAR

## 2016-07-16 MED ORDER — STERILE WATER FOR INJECTION IJ SOLN
INTRAMUSCULAR | Status: AC
  Filled 2016-07-16: qty 10

## 2016-07-17 ENCOUNTER — Encounter (HOSPITAL_COMMUNITY)
Admission: RE | Admit: 2016-07-17 | Discharge: 2016-07-17 | Disposition: A | Discharge status: Home / Self Care | Payer: 59 | Source: Ambulatory Visit | Attending: Endocrinology | Admitting: Endocrinology

## 2016-07-17 DIAGNOSIS — C73 Malignant neoplasm of thyroid gland: Secondary | ICD-10-CM | POA: Diagnosis not present

## 2016-07-17 MED ORDER — THYROTROPIN ALFA 1.1 MG IM SOLR
0.9000 mg | INTRAMUSCULAR | Status: AC
  Administered 2016-07-17: 0.9 mg via INTRAMUSCULAR

## 2016-07-18 ENCOUNTER — Encounter (HOSPITAL_COMMUNITY)
Admission: RE | Admit: 2016-07-18 | Discharge: 2016-07-18 | Disposition: A | Discharge status: Home / Self Care | Payer: 59 | Source: Ambulatory Visit | Attending: Endocrinology | Admitting: Endocrinology

## 2016-07-18 MED ORDER — SODIUM IODIDE I 131 CAPSULE
4.1000 | Freq: Once | INTRAVENOUS | Status: AC | PRN
Start: 2016-07-18 — End: 2016-07-18
  Administered 2016-07-18: 4.1 via ORAL

## 2016-07-20 ENCOUNTER — Encounter (HOSPITAL_COMMUNITY)
Admission: RE | Admit: 2016-07-20 | Discharge: 2016-07-20 | Disposition: A | Discharge status: Home / Self Care | Payer: 59 | Source: Ambulatory Visit | Attending: Endocrinology | Admitting: Endocrinology

## 2016-07-20 ENCOUNTER — Other Ambulatory Visit: Payer: Self-pay | Admitting: Endocrinology

## 2016-07-20 DIAGNOSIS — C73 Malignant neoplasm of thyroid gland: Secondary | ICD-10-CM

## 2016-07-27 ENCOUNTER — Ambulatory Visit
Admission: RE | Admit: 2016-07-27 | Discharge: 2016-07-27 | Disposition: A | Discharge status: Home / Self Care | Payer: 59 | Source: Ambulatory Visit | Attending: Endocrinology | Admitting: Endocrinology

## 2016-07-27 DIAGNOSIS — C73 Malignant neoplasm of thyroid gland: Secondary | ICD-10-CM

## 2016-07-30 NOTE — Progress Notes (Signed)
Please let patient know that the scan shows likely tiny normal thyroid tissue and non-suspicious lymph nodes, will need to repeat study before next visit

## 2016-08-01 ENCOUNTER — Telehealth: Payer: Self-pay

## 2016-08-01 NOTE — Telephone Encounter (Signed)
Called and spoke with patient about [place] results. Patient notified they would call him for another ultrasound to be set up before his next appointment in January.

## 2016-08-08 ENCOUNTER — Ambulatory Visit: Payer: 59 | Admitting: Family Medicine

## 2016-08-09 ENCOUNTER — Ambulatory Visit: Payer: 59 | Admitting: Family Medicine

## 2016-08-30 ENCOUNTER — Ambulatory Visit (INDEPENDENT_AMBULATORY_CARE_PROVIDER_SITE_OTHER): Payer: 59 | Admitting: Family Medicine

## 2016-08-30 ENCOUNTER — Encounter: Payer: Self-pay | Admitting: Family Medicine

## 2016-08-30 VITALS — BP 120/74 | HR 71 | Temp 98.1°F | Resp 16 | Ht 69.0 in | Wt 176.0 lb

## 2016-08-30 DIAGNOSIS — Z23 Encounter for immunization: Secondary | ICD-10-CM

## 2016-08-30 DIAGNOSIS — I10 Essential (primary) hypertension: Secondary | ICD-10-CM | POA: Diagnosis not present

## 2016-08-30 DIAGNOSIS — E876 Hypokalemia: Secondary | ICD-10-CM

## 2016-08-30 LAB — BASIC METABOLIC PANEL
BUN: 22 mg/dL (ref 7–25)
CALCIUM: 9 mg/dL (ref 8.6–10.3)
CO2: 27 mmol/L (ref 20–31)
Chloride: 101 mmol/L (ref 98–110)
Creat: 1.02 mg/dL (ref 0.70–1.33)
Glucose, Bld: 109 mg/dL — ABNORMAL HIGH (ref 65–99)
POTASSIUM: 3 mmol/L — AB (ref 3.5–5.3)
SODIUM: 141 mmol/L (ref 135–146)

## 2016-08-30 MED ORDER — HYDROCHLOROTHIAZIDE 25 MG PO TABS: 25.0000 mg | ORAL_TABLET | Freq: Every day | ORAL | 3 refills | Status: DC

## 2016-08-30 NOTE — Patient Instructions (Addendum)
  No changes needed on medication for now. However, if you have persistent frequent urination, any return of kidney stones, or just want to change to different medication,  I would consider amlodipine or lisinopril. Let me know if you would like to change to one of those medications. Otherwise I did send in refills of HCTZ that should last until your next physical next year.  I will also recheck your kidney function and calcium level as those were borderline abnormal previously.   IF you received an x-ray today, you will receive an invoice from [facility]. Please contact [facility] at [phone] with questions or concerns regarding your invoice.   IF you received labwork today, you will receive an invoice from [employer]. Please contact [facility] at [phone] with questions or concerns regarding your invoice.   Our billing staff will not be able to assist you with questions regarding bills from these companies.  You will be contacted with the lab results as soon as they are available. The fastest way to get your results is to activate your My Chart account. Instructions are located on the last page of this paperwork. If you have not heard from [place] regarding the results in 2 weeks, please contact this office.

## 2016-08-30 NOTE — Progress Notes (Signed)
By signing my name below, I, [name], attest that this documentation has been prepared under the direction and in the presence of [name].  Electronically Signed: [name], Medical Scribe. 08/30/16. 3:46 PM.  Subjective:    Patient ID: Cameron Perez, male    DOB: 1966-04-06, 50 y.o.   MRN: [id number]  HPI Chief Complaint  Patient presents with  . Follow-up    Hypertension  . Flu Vaccine    HPI Comments: [name] is a 50 y.o. male who presents to the [facility] for HTN follow-up. Takes HCTZ  25 mg QD. Last visit with me Feb 8th for a physical..  HTN: Well controlled at that time; continued HCTZ. Pt has to use the bathroom once a night, and uses the bathroom more often in the day. Pt's bp has been ranging around 120/78. Pt denies experiencing any negative side effects while on this medication such as light-headedness, dizziness, HA, chest pain, and SOB. Lab Results  Component Value Date   CREATININE 1.05 02/28/2016   Papillary Adenocarcinoma of thyroid: He is followed by Dr. Dwyane Dee, he is now on levothyroxine after treatment with ablation on March 2015.  Hypogonadism: Treated by [facility]. Pt is taking Clomid 50 mg for his testosterone.  Hypocalcemia: Borderline on Feb labs. Pt denies having any recent kidney stones  Immunizations: Pt would ike to get his flu shot.  Patient Active Problem List   Diagnosis Date Noted  . Special screening for malignant neoplasms, colon 09/21/2014  . 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 Thyroid cancer ([place]) 02/09/2014  . Papillary adenocarcinoma of thyroid ([place]) 01/20/2014   Past Medical History:  Diagnosis Date  . History of kidney stones 2012  . Kidney stones   . Thyroid cancer ([place]) 2015  . Thyroid disease    Past Surgical History:  Procedure Laterality Date  . LITHOTRIPSY  2011  . THYROIDECTOMY N/A 02/09/2014   Procedure: THYROIDECTOMY;  Surgeon: [name], MD;   Location: [facility];  Service: General;  Laterality: N/A;  . TIBIA FRACTURE SURGERY Left    leg  . VASECTOMY     Depression screen [facility] 2/9 08/30/2016 03/23/2016 02/08/2016 11/07/2015  Decreased Interest 0 0 0 0  Down, Depressed, Hopeless 0 0 0 0  PHQ - 2 Score 0 0 0 0   No Known Allergies Prior to Admission medications   Medication Sig Start Date End Date Taking? Authorizing Provider  CIALIS 20 MG tablet  12/17/14  Yes Historical Provider, MD  clomiPHENE (CLOMID) 50 MG tablet Take 50 mg by mouth daily. Takes 1/2 tablet daily 01/01/14  Yes Historical Provider, MD  hydrochlorothiazide (HYDRODIURIL) 25 MG tablet Take 1 tablet by mouth  daily 07/12/16  Yes [name], MD  levothyroxine (SYNTHROID, LEVOTHROID) 150 MCG tablet Take 1 tablet by mouth  daily before breakfast 05/29/16  Yes [name], MD   Social History   Social History  . Marital status: Married    Spouse name: N/A  . Number of children: 5  . Years of education: N/A   Occupational History  . 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 Smoking status: Former Smoker    Types: Cigarettes    Quit date: 06/30/2003  . Smokeless tobacco: Former Systems developer    Types: Chew    Quit date: 12/31/2006  . Alcohol use 0.6 oz/week    1 Cans of beer per week     Comment: weekly  .  Drug use: No     Comment: CHEWS NICOTINE GUM  . Sexual activity: Yes   Other Topics Concern  . Not on file   Social History Narrative   Married   Education: College   Exercise: Yes   Review of Systems  Constitutional: Negative for fatigue and unexpected weight change.  Eyes: Negative for visual disturbance.  Respiratory: Negative for cough, chest tightness and shortness of breath.   Cardiovascular: Negative for chest pain, palpitations and leg swelling.  Gastrointestinal: Negative for abdominal pain and blood in stool.  Genitourinary: Positive for frequency.  Neurological: Negative for dizziness, light-headedness and headaches.    Psychiatric/Behavioral: Positive for sleep disturbance.   Objective:  Physical Exam  Constitutional: He is oriented to person, place, and time. He appears well-developed and well-nourished.  HENT:  Head: Normocephalic and atraumatic.  Eyes: EOM are normal. Pupils are equal, round, and reactive to light.  Neck: No JVD present. Carotid bruit is not present.  Cardiovascular: Normal rate, regular rhythm and normal heart sounds.   No murmur heard. Pulmonary/Chest: Effort normal and breath sounds normal. No respiratory distress. He has no wheezes. He has no rales.  Abdominal: Soft. He exhibits no distension and no mass. There is no tenderness. There is no rebound and no guarding.  Musculoskeletal: He exhibits no edema.  Neurological: He is alert and oriented to person, place, and time.  Skin: Skin is warm and dry.  Psychiatric: He has a normal mood and affect.  Vitals reviewed.  BP 120/74   Pulse 71   Temp 98.1 F (36.7 C) (Oral)   Resp 16   Ht 5\' 9"  (1.753 m)   Wt 176 lb (79.8 kg)   SpO2 97%   BMI 25.99 kg/m  Assessment & Plan:   Cameron Perez is a 50 y.o. male Needs flu shot - Plan: Flu Vaccine QUAD 36+ mos IM  - Flu vaccine given.  Hypocalcemia - Plan: Basic metabolic panel  - Borderline on previous testing. Repeat BMP. If low, can discuss with his endocrinologist.  Essential hypertension - Plan: hydrochlorothiazide (HYDRODIURIL) 25 MG tablet, Basic metabolic panel  - Overall tolerating HCTZ, but some increased urination during the day. Offered to change to ACE inhibitor or CCB, but he chose to remain on HCTZ for now. Also noted history of kidney stones, so if any recurrence of stones, would consider changing from HCTZ to another agent as well. BMP pending, follow-up in 6 months for physical.  Meds ordered this encounter  Medications  . hydrochlorothiazide (HYDRODIURIL) 25 MG tablet    Sig: Take 1 tablet (25 mg total) by mouth daily.    Dispense:  90 tablet    Refill:   3   Patient Instructions    No changes needed on medication for now. However, if you have persistent frequent urination, any return of kidney stones, or just want to change to different medication,  I would consider amlodipine or lisinopril. Let me know if you would like to change to one of those medications. Otherwise I did send in refills of HCTZ that should last until your next physical next year.  I will also recheck your kidney function and calcium level as those were borderline abnormal previously.   IF you received an x-ray today, you will receive an invoice from [facility]. Please contact [facility]iology at [phone] with questions or concerns regarding your invoice.   IF you received labwork today, you will receive an invoice from [employer]. Please contact  [facility] at [phone] with questions or concerns regarding your invoice.   Our billing staff will not be able to assist you with questions regarding bills from these companies.  You will be contacted with the lab results as soon as they are available. The fastest way to get your results is to activate your My Chart account. Instructions are located on the last page of this paperwork. If you have not heard from [place] regarding the results in 2 weeks, please contact this office.       I personally performed the services described in this documentation, which was scribed in my presence. The recorded information has been reviewed and considered, and addended by me as needed.   Signed,   [name], MD [facility].  08/30/16 3:59 PM

## 2016-09-09 NOTE — Addendum Note (Signed)
Addended by: [name] R on: 09/09/2016 02:24 PM   Modules accepted: Orders

## 2016-09-22 ENCOUNTER — Encounter: Payer: Self-pay | Admitting: Family Medicine

## 2016-10-08 ENCOUNTER — Other Ambulatory Visit (INDEPENDENT_AMBULATORY_CARE_PROVIDER_SITE_OTHER): Payer: 59 | Admitting: Family Medicine

## 2016-10-08 ENCOUNTER — Encounter: Payer: Self-pay | Admitting: Family Medicine

## 2016-10-08 DIAGNOSIS — E876 Hypokalemia: Secondary | ICD-10-CM | POA: Diagnosis not present

## 2016-10-08 DIAGNOSIS — I10 Essential (primary) hypertension: Secondary | ICD-10-CM

## 2016-10-08 LAB — BASIC METABOLIC PANEL
BUN: 28 mg/dL — ABNORMAL HIGH (ref 7–25)
CO2: 23 mmol/L (ref 20–31)
Calcium: 8.9 mg/dL (ref 8.6–10.3)
Chloride: 103 mmol/L (ref 98–110)
Creat: 0.93 mg/dL (ref 0.70–1.33)
GLUCOSE: 90 mg/dL (ref 65–99)
POTASSIUM: 3.4 mmol/L — AB (ref 3.5–5.3)
SODIUM: 139 mmol/L (ref 135–146)

## 2016-10-09 MED ORDER — AMLODIPINE BESYLATE 5 MG PO TABS: 5.0000 mg | ORAL_TABLET | Freq: Every day | ORAL | 0 refills | Status: DC

## 2016-10-09 NOTE — Progress Notes (Signed)
Lab only visit 

## 2016-11-11 ENCOUNTER — Encounter: Payer: Self-pay | Admitting: Family Medicine

## 2016-11-11 ENCOUNTER — Other Ambulatory Visit: Payer: Self-pay | Admitting: Family Medicine

## 2016-11-11 DIAGNOSIS — I10 Essential (primary) hypertension: Secondary | ICD-10-CM

## 2016-11-13 MED ORDER — AMLODIPINE BESYLATE 5 MG PO TABS: 5.0000 mg | ORAL_TABLET | Freq: Every day | ORAL | 0 refills | Status: DC

## 2016-12-01 ENCOUNTER — Other Ambulatory Visit: Payer: Self-pay

## 2016-12-01 DIAGNOSIS — I10 Essential (primary) hypertension: Secondary | ICD-10-CM

## 2016-12-01 MED ORDER — AMLODIPINE BESYLATE 5 MG PO TABS: 5.0000 mg | ORAL_TABLET | Freq: Every day | ORAL | 0 refills | Status: DC

## 2016-12-01 NOTE — Telephone Encounter (Signed)
[employer] request for 90 days thru mail service - sent today

## 2016-12-08 ENCOUNTER — Encounter: Payer: Self-pay | Admitting: Family Medicine

## 2016-12-13 ENCOUNTER — Other Ambulatory Visit: Payer: Self-pay | Admitting: Endocrinology

## 2017-01-08 ENCOUNTER — Other Ambulatory Visit (INDEPENDENT_AMBULATORY_CARE_PROVIDER_SITE_OTHER): Payer: 59

## 2017-01-08 DIAGNOSIS — C73 Malignant neoplasm of thyroid gland: Secondary | ICD-10-CM

## 2017-01-08 LAB — T4, FREE: FREE T4: 1.19 ng/dL (ref 0.60–1.60)

## 2017-01-08 LAB — TSH: TSH: 1.54 u[IU]/mL (ref 0.35–4.50)

## 2017-01-09 LAB — THYROGLOBULIN BY IMA: Thyroglobulin by IMA: 1.9 ng/mL (ref 1.4–29.2)

## 2017-01-09 LAB — TGAB+THYROGLOBULIN IMA OR LCMS: Thyroglobulin Antibody: <1 IU/mL (ref 0.0–0.9)

## 2017-01-11 ENCOUNTER — Encounter: Payer: Self-pay | Admitting: Endocrinology

## 2017-01-11 ENCOUNTER — Ambulatory Visit (INDEPENDENT_AMBULATORY_CARE_PROVIDER_SITE_OTHER): Payer: 59 | Admitting: Endocrinology

## 2017-01-11 VITALS — BP 122/74 | HR 69 | Ht 69.0 in | Wt 181.0 lb

## 2017-01-11 DIAGNOSIS — C73 Malignant neoplasm of thyroid gland: Secondary | ICD-10-CM

## 2017-01-11 DIAGNOSIS — E890 Postprocedural hypothyroidism: Secondary | ICD-10-CM

## 2017-01-11 NOTE — Progress Notes (Signed)
Patient ID: Cameron Perez, male   DOB: Dec 06, 1966, 51 y.o.   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 - FOCAL EXTRATHYROID EXTENSION.  Margins: Free of tumor.  Lymph - Vascular invasion: Present. 1 positive lymph node  TREATMENT: He had 76 mCi of I-131 for remnant ablation on 03/29/14 His post therapy body scan showed 3 foci of uptake in the neck with possible uptake in one lymph node  Monitoring: Subsequent whole-body scan in 11/15 did not show any activity His thyroglobulin level in 10/15 was relatively high at 3.6, previously 2.9 The level was improved  at 0.9 in 05/2015 More recently his thyroglobulin is 1.9 in 1/18, prior to this was 2.3  Thyroid ultrasound in 7/17 showed borderline lymph node alignment seen on the ultrasound along with possible thyroid remnant  HYPOTHYROIDISM He has been on levothyroxine which he has been taking regularly in the morning about half hour before breakfast Not taking any calcium   No complaints of recent fatigue and feels well Continues to be taking 150 g   TSH is now consistently normal and now 1.5    Lab Results  Component Value Date   FREET4 1.19 01/08/2017   FREET4 0.94 07/05/2014   FREET4 1.63 (H) 03/03/2014   TSH 1.54 01/08/2017   TSH 0.66 06/29/2016   TSH 0.43 10/31/2015      Allergies as of 01/11/2017   No Known Allergies     Medication List       Accurate as of 01/11/17  9:23 AM. Always use your most recent med list.          amLODipine 5 MG tablet Commonly known as:  NORVASC Take 1 tablet (5 mg total) by mouth daily.   CIALIS 20 MG tablet Generic drug:  tadalafil   clomiPHENE 50 MG tablet Commonly known as:  CLOMID Take  50 mg by mouth daily. Takes 1/2 tablet daily   levothyroxine 150 MCG tablet Commonly known as:  SYNTHROID, LEVOTHROID TAKE 1 TABLET BY MOUTH  DAILY BEFORE BREAKFAST       Allergies: No Known Allergies  Past Medical History:  Diagnosis Date  . History of kidney stones 2012  . Kidney stones   . Thyroid cancer ([place]) 2015  . Thyroid disease     Past Surgical History:  Procedure Laterality Date  . LITHOTRIPSY  2011  . THYROIDECTOMY N/A 02/09/2014   Procedure: THYROIDECTOMY;  Surgeon: [name], MD;  Location: [facility];  Service: General;  Laterality: N/A;  . TIBIA FRACTURE SURGERY Left    leg  . VASECTOMY      Family History  Problem Relation Age of Onset  . Breast cancer Mother   . Cancer Mother     Breast  . Hypertension Mother   . Breast cancer Maternal Grandmother   . Cancer Maternal Grandmother   . Diabetes Paternal Grandfather   . Hyperlipidemia Paternal Grandfather   . Colon cancer Neg Hx     Social History:  reports that he quit smoking about 13 years ago. His smoking use included Cigarettes. He quit smokeless tobacco use about 10 years ago. His smokeless tobacco use included Chew. He reports that he drinks about 0.6 oz of alcohol  per week . He reports that he does not use drugs.   Review of Systems:  HYPOGONADISM: This has been diagnosed and treated by a urologist locally.   Not clear what his diagnosis is and no records are available His prolactin level Was normal He has been on treatment on and off for about 2 years with clomiphene and is taking a half tablet   EXAM:  BP 122/74   Pulse 69   Ht 5\' 9"  (1.753 m)   Wt 181 lb (82.1 kg)   SpO2 95%   BMI 26.73 kg/m   Neck exam shows no palpable abnormality in the thyroid bed or lymphadenopathy Right biceps reflexes normal   Assessment/Plan:  Hypothyroidism:  Subjectively doing well His TSH is consistently normal  with current regimen of 150 mcg daily  He is compliant with this regimen He  will continue the same dose  Thyroid cancer: 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

## 2017-01-16 ENCOUNTER — Other Ambulatory Visit: Payer: 59

## 2017-01-19 ENCOUNTER — Encounter: Payer: Self-pay | Admitting: Endocrinology

## 2017-01-21 ENCOUNTER — Encounter: Payer: Self-pay | Admitting: Family Medicine

## 2017-01-21 ENCOUNTER — Ambulatory Visit (INDEPENDENT_AMBULATORY_CARE_PROVIDER_SITE_OTHER): Payer: 59 | Admitting: Family Medicine

## 2017-01-21 VITALS — BP 116/86 | HR 81 | Temp 97.5°F | Resp 18 | Ht 69.0 in | Wt 180.0 lb

## 2017-01-21 DIAGNOSIS — J0120 Acute ethmoidal sinusitis, unspecified: Secondary | ICD-10-CM | POA: Diagnosis not present

## 2017-01-21 DIAGNOSIS — H66002 Acute suppurative otitis media without spontaneous rupture of ear drum, left ear: Secondary | ICD-10-CM

## 2017-01-21 DIAGNOSIS — I10 Essential (primary) hypertension: Secondary | ICD-10-CM | POA: Diagnosis not present

## 2017-01-21 MED ORDER — AMOXICILLIN-POT CLAVULANATE 875-125 MG PO TABS: 1.0000 | ORAL_TABLET | Freq: Two times a day (BID) | ORAL | 0 refills | Status: DC

## 2017-01-21 NOTE — Progress Notes (Signed)
Chief Complaint  Patient presents with  . Sore Throat  . Nasal Congestion    HPI   URI Pt took sudafed this morning  Pt reports that he was really congested and coughing His cough is dry and aggravating Reports that he has had chills He denies shortness of breath or wheezing He denies history of asthma This morning he also used flonase one spray each nostril that he bought otc He denies headaches or dental pain He denies tinnitus, otalgia or dizziness He had his flu shot  Hypertension: He has history of hypertension that is well controlled.  He states that he has been eating and drinking. He reports that he feels well and denies headaches and chest pains.  Reports that he takes sudafed but this does not affect his blood pressure. Use of agents associated with hypertension: decongestants. History of target organ damage: none. BP Readings from Last 3 Encounters:  01/21/17 116/86  01/11/17 122/74  08/30/16 120/74     Past Medical History:  Diagnosis Date  . History of kidney stones 2012  . Kidney stones   . Thyroid cancer ([place]) 2015  . Thyroid disease     Current Outpatient Prescriptions  Medication Sig Dispense Refill  . amLODipine (NORVASC) 5 MG tablet Take 1 tablet (5 mg total) by mouth daily. 90 tablet 0  . CIALIS 20 MG tablet     . clomiPHENE (CLOMID) 50 MG tablet Take 50 mg by mouth daily. Takes 1/2 tablet daily    . levothyroxine (SYNTHROID, LEVOTHROID) 150 MCG tablet TAKE 1 TABLET BY MOUTH  DAILY BEFORE BREAKFAST 90 tablet 2  . amoxicillin-clavulanate (AUGMENTIN) 875-125 MG tablet Take 1 tablet by mouth 2 (two) times daily. 20 tablet 0   No current facility-administered medications for this visit.     Allergies: No Known Allergies  Past Surgical History:  Procedure Laterality Date  . LITHOTRIPSY  2011  . THYROIDECTOMY N/A 02/09/2014   Procedure: THYROIDECTOMY;  Surgeon: [name], MD;  Location: [facility];  Service: General;  Laterality: N/A;  .  TIBIA FRACTURE SURGERY Left    leg  . VASECTOMY      Social History   Social History  . Marital status: Married    Spouse name: N/A  . Number of children: 5  . Years of education: N/A   Occupational History  . 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 Smoking status: Former Smoker    Types: Cigarettes    Quit date: 06/30/2003  . Smokeless tobacco: Former Systems developer    Types: Chew    Quit date: 12/31/2006  . Alcohol use 0.6 oz/week    1 Cans of beer per week     Comment: weekly  . Drug use: No     Comment: CHEWS NICOTINE GUM  . Sexual activity: Yes   Other Topics Concern  . None   Social History Narrative   Married   Education: Secretary/administrator   Exercise: Yes    ROS  Objective: Vitals:   01/21/17 1103  BP: 116/86  Pulse: 81  Resp: 18  Temp: 97.5 F (36.4 C)  TempSrc: Oral  SpO2: 98%  Weight: 180 lb (81.6 kg)  Height: 5\' 9"  (1.753 m)    Physical Exam General: alert, oriented, in NAD Head: normocephalic, atraumatic, no sinus tenderness Eyes: EOM intact, no scleral icterus or conjunctival injection Ears: TM clear bilaterally Throat: no pharyngeal exudate or erythema Lymph: no posterior auricular, submental or cervical  lymph adenopathy Heart: normal rate, normal sinus rhythm, no murmurs Lungs: clear to auscultation bilaterally, no wheezing   Assessment and Plan Cameron Perez was seen today for sore throat and nasal congestion.  Diagnoses and all orders for this visit:  Acute suppurative otitis media of left ear without spontaneous rupture of tympanic membrane, recurrence not specified Acute non-recurrent ethmoidal sinusitis -          pt with sinus symptoms and otitis media and sinus infection -          Continue supportive care -     amoxicillin-clavulanate (AUGMENTIN) 875-125 MG tablet; Take 1 tablet by mouth 2 (two) times daily.  Essential hypertension- continue blood pressure medications Discussed that he should monitor his blood  pressures if he has headaches     Maraya Gwilliam A Nolon Rod

## 2017-01-21 NOTE — Patient Instructions (Addendum)
     IF you received an x-ray today, you will receive an invoice from [facility]. Please contact [facility] at [phone] with questions or concerns regarding your invoice.   IF you received labwork today, you will receive an invoice from [place]. Please contact [facility] at [phone] with questions or concerns regarding your invoice.   Our billing staff will not be able to assist you with questions regarding bills from these companies.  You will be contacted with the lab results as soon as they are available. The fastest way to get your results is to activate your My Chart account. Instructions are located on the last page of this paperwork. If you have not heard from [place] regarding the results in 2 weeks, please contact this office.      Otitis Media, Adult Otitis media is redness, soreness, and inflammation of the middle ear. Otitis media may be caused by allergies or, most commonly, by infection. Often it occurs as a complication of the common cold. What are the signs or symptoms? Symptoms of otitis media may include:  Earache.  Fever.  Ringing in your ear.  Headache.  Leakage of fluid from the ear. How is this diagnosed? To diagnose otitis media, your health care provider will examine your ear with an otoscope. This is an instrument that allows your health care provider to see into your ear in order to examine your eardrum. Your health care provider also will ask you questions about your symptoms. How is this treated? Typically, otitis media resolves on its own within 3-5 days. Your health care provider may prescribe medicine to ease your symptoms of pain. If otitis media does not resolve within 5 days or is recurrent, your health care provider may prescribe antibiotic medicines if he or she suspects that a bacterial infection is the cause. Follow these instructions at home:  If you were prescribed an antibiotic medicine, finish it all even if you start  to feel better.  Take medicines only as directed by your health care provider.  Keep all follow-up visits as directed by your health care provider. Contact a health care provider if:  You have otitis media only in one ear, or bleeding from your nose, or both.  You notice a lump on your neck.  You are not getting better in 3-5 days.  You feel worse instead of better. Get help right away if:  You have pain that is not controlled with medicine.  You have swelling, redness, or pain around your ear or stiffness in your neck.  You notice that part of your face is paralyzed.  You notice that the bone behind your ear (mastoid) is tender when you touch it. This information is not intended to replace advice given to you by your health care provider. Make sure you discuss any questions you have with your health care provider. Document Released: 09/21/2004 Document Revised: 05/24/2016 Document Reviewed: 07/14/2013 Elsevier Interactive Patient Education  2017 [employer].

## 2017-01-24 ENCOUNTER — Other Ambulatory Visit: Payer: Self-pay | Admitting: Family Medicine

## 2017-01-24 DIAGNOSIS — I10 Essential (primary) hypertension: Secondary | ICD-10-CM

## 2017-01-28 ENCOUNTER — Ambulatory Visit
Admission: RE | Admit: 2017-01-28 | Discharge: 2017-01-28 | Disposition: A | Discharge status: Home / Self Care | Payer: 59 | Source: Ambulatory Visit | Attending: Endocrinology | Admitting: Endocrinology

## 2017-01-28 DIAGNOSIS — C73 Malignant neoplasm of thyroid gland: Secondary | ICD-10-CM

## 2017-01-28 DIAGNOSIS — E041 Nontoxic single thyroid nodule: Secondary | ICD-10-CM | POA: Diagnosis not present

## 2017-03-07 ENCOUNTER — Encounter: Payer: Self-pay | Admitting: Family Medicine

## 2017-03-08 ENCOUNTER — Other Ambulatory Visit: Payer: Self-pay | Admitting: Physician Assistant

## 2017-03-08 DIAGNOSIS — I10 Essential (primary) hypertension: Secondary | ICD-10-CM

## 2017-03-10 ENCOUNTER — Other Ambulatory Visit: Payer: Self-pay | Admitting: Family Medicine

## 2017-03-10 DIAGNOSIS — I10 Essential (primary) hypertension: Secondary | ICD-10-CM

## 2017-03-10 MED ORDER — AMLODIPINE BESYLATE 5 MG PO TABS: 5.0000 mg | ORAL_TABLET | Freq: Every day | ORAL | 0 refills | Status: DC

## 2017-04-01 ENCOUNTER — Ambulatory Visit (INDEPENDENT_AMBULATORY_CARE_PROVIDER_SITE_OTHER): Payer: 59 | Admitting: Family Medicine

## 2017-04-01 VITALS — BP 119/82 | HR 91 | Temp 98.0°F | Resp 16 | Ht 68.5 in | Wt 183.0 lb

## 2017-04-01 DIAGNOSIS — I10 Essential (primary) hypertension: Secondary | ICD-10-CM | POA: Diagnosis not present

## 2017-04-01 DIAGNOSIS — S8011XA Contusion of right lower leg, initial encounter: Secondary | ICD-10-CM

## 2017-04-01 MED ORDER — AMLODIPINE BESYLATE 5 MG PO TABS: 5.0000 mg | ORAL_TABLET | Freq: Every day | ORAL | 1 refills | Status: DC

## 2017-04-01 NOTE — Progress Notes (Signed)
By signing my name below, I, [name], attest that this documentation has been prepared under the direction and in the presence of [name], MD.  Electronically Signed: [name], Medical Scribe. 04/01/17. 3:33 PM.  Subjective:    Patient ID: Cameron Perez, male    DOB: 06/05/1966, 51 y.o.   MRN: [id number]  HPI Chief Complaint  Patient presents with  . Hypertension    38m follow up     HPI Comments: Cameron Perez is a 51 y.o. male who presents to the Primary [facility] at [facility] for follow-up. Pt was last seen by me Aug 2017 and bp was 120/774 at that time. Pt had lunch today.  HTN: Takes amlodipine 5 mg QD. Emailed Dec 9th about home readings showing controlled home blood pressure. Pt is compliant with his medication. Denies chest pain, light-headedness, dizziness, HA, or other acute side effects. Lab Results  Component Value Date   CREATININE 0.93 10/08/2016   Lab Results  Component Value Date   CHOL 135 02/08/2016   HDL 37 (L) 02/08/2016   LDLCALC 86 02/08/2016   TRIG 59 02/08/2016   CHOLHDL 3.6 02/08/2016    Thyroid Carcinoma: Followed by Dr. Dwyane Dee and treated with ablation March 2016. Takes levothyroxine.  Hypogonadism: Followed by [facility]. Takes clomid 50 mg.   Tibia Injury: Pt was kicked in his right shine a month ago during Judo with blueish bruising initially; has resolved now, 2 days ago he's noticed a non painful knot in that same area. Denies calf pain.  Patient Active Problem List   Diagnosis Date Noted  . Special screening for malignant neoplasms, colon 09/21/2014  . 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 Thyroid cancer ([place]) 02/09/2014  . 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 History of kidney stones 2012  . Kidney stones   . Thyroid cancer ([place]) 2015  . Thyroid disease    Past Surgical History:  Procedure Laterality Date  .  LITHOTRIPSY  2011  . THYROIDECTOMY N/A 02/09/2014   Procedure: THYROIDECTOMY;  Surgeon: [name], MD;  Location: [facility];  Service: General;  Laterality: N/A;  . TIBIA FRACTURE SURGERY Left    leg  . VASECTOMY     No Known Allergies Prior to Admission medications   Medication Sig Start Date End Date Taking? Authorizing Provider  amLODipine (NORVASC) 5 MG tablet Take 1 tablet (5 mg total) by mouth daily. 03/10/17  Yes [name], MD  b complex vitamins capsule Take 1 capsule by mouth daily.   Yes Historical Provider, MD  CIALIS 20 MG tablet  12/17/14  Yes Historical Provider, MD  clomiPHENE (CLOMID) 50 MG tablet Take 50 mg by mouth daily. Takes 1/2 tablet daily 01/01/14  Yes Historical Provider, MD  Cyanocobalamin (VITAMIN B 12) 100 MCG LOZG Take by mouth.   Yes Historical Provider, MD  levothyroxine (SYNTHROID, LEVOTHROID) 150 MCG tablet TAKE 1 TABLET BY MOUTH  DAILY BEFORE BREAKFAST 12/14/16  Yes [name], MD  Misc Natural Products (OSTEO BI-FLEX JOINT SHIELD PO) Take by mouth.   Yes Historical Provider, MD  Potassium 99 MG TABS Take by mouth.   Yes Historical Provider, MD   Social History   Social History  . Marital status: Married    Spouse name: N/A  . Number of children: 5  . Years of education: N/A   Occupational History  . 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 Smoking status: Former Smoker    Types: Cigarettes    Quit date: 06/30/2003  . Smokeless tobacco: Former Systems developer    Types: Chew    Quit date: 12/31/2006  . Alcohol use 0.6 oz/week    1 Cans of beer per week     Comment: weekly  . Drug use: No     Comment: CHEWS NICOTINE GUM  . Sexual activity: Yes   Other Topics Concern  . Not on file   Social History Narrative   Married   Education: College   Exercise: Yes   Review of Systems  Constitutional: Negative for fatigue and unexpected weight change.  Eyes: Negative for visual disturbance.  Respiratory: Negative for  cough, chest tightness and shortness of breath.   Cardiovascular: Negative for chest pain, palpitations and leg swelling.  Gastrointestinal: Negative for abdominal pain and blood in stool.  Neurological: Negative for dizziness, light-headedness and headaches.  Hematological: Does not bruise/bleed easily.   Objective:  Physical Exam  Constitutional: He appears well-developed and well-nourished. No distress.  HENT:  Head: Normocephalic and atraumatic.  Eyes: Conjunctivae are normal.  Neck: Neck supple.  Cardiovascular: Normal rate, regular rhythm and normal heart sounds.  Exam reveals no gallop and no friction rub.   No murmur heard. Pulmonary/Chest: Effort normal and breath sounds normal. No respiratory distress. He has no wheezes. He has no rales.  Musculoskeletal: He exhibits no edema or tenderness.  Firm area approx 2-3 cm across on the medial mid tibia Skin intact No effusion No edema No lower extremity edema No calf swelling or tenderness  Neurological: He is alert.  Skin: Skin is warm, dry and intact.  Psychiatric: He has a normal mood and affect. His behavior is normal.  Nursing note and vitals reviewed.  Vitals:   04/01/17 1436  BP: 119/82  Pulse: 91  Resp: 16  Temp: 98 F (36.7 C)  SpO2: 98%  Weight: 183 lb (83 kg)  Height: 5' 8.5" (1.74 m)  Body mass index is 27.42 kg/m.  Assessment & Plan:    Cameron Perez is a 52 y.o. male Essential hypertension - Plan: Comprehensive metabolic panel, Lipid panel, amLODipine (NORVASC) 5 MG tablet  - Stable. Continue 5 mg Norvasc. Plan for physical in the next 6 months. Return for fasting labs next few weeks.  Contusion of right lower extremity, initial encounter  - Small area of soft tissue swelling medial right lower leg, but nontender on exam. No pain with physical activity. Suspected prior contusion with residual soft tissue swelling. Discussed x-ray, but can wait another 4-6 weeks to see if the area improves. RTC  precautions  Meds ordered this encounter  Medications  . Cyanocobalamin (VITAMIN B 12) 100 MCG LOZG    Sig: Take by mouth.  Marland Kitchen b complex vitamins capsule    Sig: Take 1 capsule by mouth daily.  . Misc Natural Products (OSTEO BI-FLEX JOINT SHIELD PO)    Sig: Take by mouth.  . Potassium 99 MG TABS    Sig: Take by mouth.  Marland Kitchen amLODipine (NORVASC) 5 MG tablet    Sig: Take 1 tablet (5 mg total) by mouth daily.    Dispense:  90 tablet    Refill:  1   Patient Instructions    No change in medications for now. Return for fasting labs at your convenience in the next week or 2 if possible. Plan for physical within the next 6 months.  Your leg swelling appears  to be due to a contusion. If any pain in that area or the swelling is not improving in the next 4-6 weeks, return for possible x-ray.   Contusion A contusion is a deep bruise. Contusions are the result of a blunt injury to tissues and muscle fibers under the skin. The injury causes bleeding under the skin. The skin overlying the contusion may turn blue, purple, or yellow. Minor injuries will give you a painless contusion, but more severe contusions may stay painful and swollen for a few weeks. What are the causes? This condition is usually caused by a blow, trauma, or direct force to an area of the body. What are the signs or symptoms? Symptoms of this condition include:  Swelling of the injured area.  Pain and tenderness in the injured area.  Discoloration. The area may have redness and then turn blue, purple, or yellow. How is this diagnosed? This condition is diagnosed based on a physical exam and medical history. An X-ray, CT scan, or MRI may be needed to determine if there are any associated injuries, such as broken bones (fractures). How is this treated? Specific treatment for this condition depends on what area of the body was injured. In general, the best treatment for a contusion is resting, icing, applying pressure to  (compression), and elevating the injured area. This is often called the RICE strategy. Over-the-counter anti-inflammatory medicines may also be recommended for pain control. Follow these instructions at home:  Rest the injured area.  If directed, apply ice to the injured area:  Put ice in a plastic bag.  Place a towel between your skin and the bag.  Leave the ice on for 20 minutes, 2-3 times per day.  If directed, apply light compression to the injured area using an elastic bandage. Make sure the bandage is not wrapped too tightly. Remove and reapply the bandage as directed by your health care provider.  If possible, raise (elevate) the injured area above the level of your heart while you are sitting or lying down.  Take over-the-counter and prescription medicines only as told by your health care provider. Contact a health care provider if:  Your symptoms do not improve after several days of treatment.  Your symptoms get worse.  You have difficulty moving the injured area. Get help right away if:  You have severe pain.  You have numbness in a hand or foot.  Your hand or foot turns pale or cold. This information is not intended to replace advice given to you by your health care provider. Make sure you discuss any questions you have with your health care provider. Document Released: 09/26/2005 Document Revised: 04/26/2016 Document Reviewed: 05/04/2015 Elsevier Interactive Patient Education  2017 [employer].    IF you received an x-ray today, you will receive an invoice from [facility]. Please contact [facility] at [phone] with questions or concerns regarding your invoice.   IF you received labwork today, you will receive an invoice from [place]. Please contact [facility] at [phone] with questions or concerns regarding your invoice.   Our billing staff will not be able to assist you with questions regarding bills from these companies.  You will  be contacted with the lab results as soon as they are available. The fastest way to get your results is to activate your My Chart account. Instructions are located on the last page of this paperwork. If you have not heard from [place] regarding the results in 2 weeks, please contact this office.  I personally performed the services described in this documentation, which was scribed in my presence. The recorded information has been reviewed and considered for accuracy and completeness, addended by me as needed, and agree with information above.  Signed,   [name], MD Primary [facility] at [place].  04/01/17 4:08 PM

## 2017-04-01 NOTE — Patient Instructions (Addendum)
No change in medications for now. Return for fasting labs at your convenience in the next week or 2 if possible. Plan for physical within the next 6 months.  Your leg swelling appears to be due to a contusion. If any pain in that area or the swelling is not improving in the next 4-6 weeks, return for possible x-ray.   Contusion A contusion is a deep bruise. Contusions are the result of a blunt injury to tissues and muscle fibers under the skin. The injury causes bleeding under the skin. The skin overlying the contusion may turn blue, purple, or yellow. Minor injuries will give you a painless contusion, but more severe contusions may stay painful and swollen for a few weeks. What are the causes? This condition is usually caused by a blow, trauma, or direct force to an area of the body. What are the signs or symptoms? Symptoms of this condition include:  Swelling of the injured area.  Pain and tenderness in the injured area.  Discoloration. The area may have redness and then turn blue, purple, or yellow. How is this diagnosed? This condition is diagnosed based on a physical exam and medical history. An X-ray, CT scan, or MRI may be needed to determine if there are any associated injuries, such as broken bones (fractures). How is this treated? Specific treatment for this condition depends on what area of the body was injured. In general, the best treatment for a contusion is resting, icing, applying pressure to (compression), and elevating the injured area. This is often called the RICE strategy. Over-the-counter anti-inflammatory medicines may also be recommended for pain control. Follow these instructions at home:  Rest the injured area.  If directed, apply ice to the injured area:  Put ice in a plastic bag.  Place a towel between your skin and the bag.  Leave the ice on for 20 minutes, 2-3 times per day.  If directed, apply light compression to the injured area using an elastic  bandage. Make sure the bandage is not wrapped too tightly. Remove and reapply the bandage as directed by your health care provider.  If possible, raise (elevate) the injured area above the level of your heart while you are sitting or lying down.  Take over-the-counter and prescription medicines only as told by your health care provider. Contact a health care provider if:  Your symptoms do not improve after several days of treatment.  Your symptoms get worse.  You have difficulty moving the injured area. Get help right away if:  You have severe pain.  You have numbness in a hand or foot.  Your hand or foot turns pale or cold. This information is not intended to replace advice given to you by your health care provider. Make sure you discuss any questions you have with your health care provider. Document Released: 09/26/2005 Document Revised: 04/26/2016 Document Reviewed: 05/04/2015 Elsevier Interactive Patient Education  2017 [employer].    IF you received an x-ray today, you will receive an invoice from [facility]. Please contact [facility] at [phone] with questions or concerns regarding your invoice.   IF you received labwork today, you will receive an invoice from [facility]. Please contact [facility] at [phone] with questions or concerns regarding your invoice.   Our billing staff will not be able to assist you with questions regarding bills from these companies.  You will be contacted with the lab results as soon as they are available. The fastest way to get your results is  to activate your My Chart account. Instructions are located on the last page of this paperwork. If you have not heard from us regarding the results in 2 weeks, please contact this office.      

## 2017-06-04 ENCOUNTER — Encounter: Payer: Self-pay | Admitting: Physician Assistant

## 2017-06-04 ENCOUNTER — Ambulatory Visit (INDEPENDENT_AMBULATORY_CARE_PROVIDER_SITE_OTHER): Payer: 59 | Admitting: Physician Assistant

## 2017-06-04 DIAGNOSIS — R059 Cough, unspecified: Secondary | ICD-10-CM

## 2017-06-04 DIAGNOSIS — J0110 Acute frontal sinusitis, unspecified: Secondary | ICD-10-CM

## 2017-06-04 DIAGNOSIS — R05 Cough: Secondary | ICD-10-CM | POA: Diagnosis not present

## 2017-06-04 MED ORDER — AMOXICILLIN-POT CLAVULANATE 875-125 MG PO TABS: 1.0000 | ORAL_TABLET | Freq: Two times a day (BID) | ORAL | 0 refills | Status: DC

## 2017-06-04 MED ORDER — PROMETHAZINE-DM 6.25-15 MG/5ML PO SYRP: 5.0000 mL | ORAL_SOLUTION | Freq: Four times a day (QID) | ORAL | 0 refills | Status: DC | PRN

## 2017-06-04 NOTE — Patient Instructions (Addendum)
Stay well hydrated. Come back if you are not better in 5-7 days.  Warm salt water gargles may help your sore throat.   Thank you for coming in today. I hope you feel we met your needs.  Feel free to call [facility] if you have any questions or further requests.  Please consider signing up for MyChart if you do not already have it, as this is a great way to communicate with me.  Best,  Whitney McVey, PA-C   IF you received an x-ray today, you will receive an invoice from [facility]. Please contact [facility] at [phone] with questions or concerns regarding your invoice.   IF you received labwork today, you will receive an invoice from [facility]. Please contact [facility] at [phone] with questions or concerns regarding your invoice.   Our billing staff will not be able to assist you with questions regarding bills from these companies.  You will be contacted with the lab results as soon as they are available. The fastest way to get your results is to activate your My Chart account. Instructions are located on the last page of this paperwork. If you have not heard from [place] regarding the results in 2 weeks, please contact this office.

## 2017-06-04 NOTE — Progress Notes (Signed)
Cameron Perez  MRN: [id number] DOB: [birth year]  PCP: [name], MD  Subjective:  Pt is a 51 year old male PMH thyroid cancer who presents to clinic for cough, head congestion x 1 week. His symptoms are worsening. He endorses sinus pain and pressure. His granddaughter recently was treated with antibiotics for pharyngitis and ear infection.  Denies fever, chills, congestion, n/v/d, chest pain, palpitations.  He is taking NyQuil to help his sleep.    Review of Systems  Constitutional: Negative for chills, diaphoresis, fatigue and fever.  HENT: Positive for congestion, sinus pain and sinus pressure.   Respiratory: Positive for cough. Negative for chest tightness, shortness of breath and wheezing.   Cardiovascular: Negative for chest pain and palpitations.    Patient Active Problem List   Diagnosis Date Noted  . Special screening for malignant neoplasms, colon 09/21/2014  . 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 Thyroid cancer ([place]) 02/09/2014  . 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 amLODipine (NORVASC) 5 MG tablet Take 1 tablet (5 mg total) by mouth daily. 90 tablet 1  . b complex vitamins capsule Take 1 capsule by mouth daily.    Marland Kitchen CIALIS 20 MG tablet     . clomiPHENE (CLOMID) 50 MG tablet Take 50 mg by mouth daily. Takes 1/2 tablet daily    . Cyanocobalamin (VITAMIN B 12) 100 MCG LOZG Take by mouth.    . levothyroxine (SYNTHROID, LEVOTHROID) 150 MCG tablet TAKE 1 TABLET BY MOUTH  DAILY BEFORE BREAKFAST 90 tablet 2  . Misc Natural Products (OSTEO BI-FLEX JOINT SHIELD PO) Take by mouth.    . Potassium 99 MG TABS Take by mouth.     No current facility-administered medications on file prior to visit.     No Known Allergies   Objective:  BP (!) 142/89 (BP Location: Right Arm, Patient Position: Sitting, Cuff Size: Small)   Pulse 68   Temp 97.8 F (36.6 C)  (Oral)   Resp 16   Ht 5' 8.5" (1.74 m)   Wt 182 lb (82.6 kg)   SpO2 95%   BMI 27.27 kg/m   Physical Exam  Constitutional: He is oriented to person, place, and time and well-developed, well-nourished, and in no distress. No distress.  HENT:  Right Ear: Tympanic membrane normal.  Left Ear: Tympanic membrane normal.  Nose: Mucosal edema present. No rhinorrhea. Right sinus exhibits frontal sinus tenderness. Right sinus exhibits no maxillary sinus tenderness. Left sinus exhibits frontal sinus tenderness. Left sinus exhibits no maxillary sinus tenderness.  Mouth/Throat: Oropharynx is clear and moist and mucous membranes are normal.  Cardiovascular: Normal rate, regular rhythm and normal heart sounds.   Pulmonary/Chest: Effort normal. He has no wheezes. He has no rales.  Neurological: He is alert and oriented to person, place, and time. GCS score is 15.  Skin: Skin is warm and dry.  Psychiatric: Mood, memory, affect and judgment normal.  Vitals reviewed.   Assessment and Plan :  1. Acute non-recurrent frontal sinusitis - amoxicillin-clavulanate (AUGMENTIN) 875-125 MG tablet; Take 1 tablet by mouth 2 (two) times daily.  Dispense: 20 tablet; Refill: 0 2. Cough - promethazine-dextromethorphan (PROMETHAZINE-DM) 6.25-15 MG/5ML syrup; Take 5 mLs by mouth 4 (four) times daily as needed for cough.  Dispense: 118 mL; Refill: 0 - Supportive care encouraged: Push fluids and rest. RTC if no improvement in 5-7 days. He agrees with plan.  Mercer Pod, PA-C  [facility] 06/04/2017 9:02 AM

## 2017-06-06 ENCOUNTER — Encounter: Payer: Self-pay | Admitting: Family Medicine

## 2017-06-08 ENCOUNTER — Ambulatory Visit (INDEPENDENT_AMBULATORY_CARE_PROVIDER_SITE_OTHER): Payer: 59 | Admitting: Family Medicine

## 2017-06-08 ENCOUNTER — Encounter: Payer: Self-pay | Admitting: Family Medicine

## 2017-06-08 ENCOUNTER — Ambulatory Visit (INDEPENDENT_AMBULATORY_CARE_PROVIDER_SITE_OTHER): Payer: 59

## 2017-06-08 VITALS — BP 113/76 | HR 89 | Temp 97.5°F | Resp 18 | Ht 69.69 in | Wt 183.0 lb

## 2017-06-08 DIAGNOSIS — S83411A Sprain of medial collateral ligament of right knee, initial encounter: Secondary | ICD-10-CM

## 2017-06-08 DIAGNOSIS — M25561 Pain in right knee: Secondary | ICD-10-CM

## 2017-06-08 NOTE — Patient Instructions (Addendum)
I am suspicious for possible MCL strain, but meniscus tear also possible.  Try the hinged brace, adjust activity/twisting for next 2 weeks then slowly increase as tolerated. Recheck in 3-4 weeks. Sooner if worse.   Medial Collateral Knee Ligament Sprain, Phase I Rehab Ask your health care provider which exercises are safe for you. Do exercises exactly as told by your health care provider and adjust them as directed. It is normal to feel mild stretching, pulling, tightness, or discomfort as you do these exercises, but you should stop right away if you feel sudden pain or your pain gets worse.Do not begin these exercises until told by your health care provider. Stretching and range of motion exercises These exercises warm up your muscles and joints and improve the movement and flexibility of your knee. These exercises also help to relieve pain, numbness, and tingling. Exercise A: Knee flexion, passive 1. Start this exercise in one of these positions: ? Lying on the floor in front of an open doorway, with your left / right heel and foot lightly touching the wall. ? Lying on the floor with both feet on the wall. 2. Without using any effort, allow gravity to let your foot slide down the wall slowly until you feel a gentle stretch in the front of your left / right knee. 3. Hold this stretch for __________ seconds. 4. Return your leg to the starting position, using your healthy leg to do the work or to help if needed. Repeat __________ times. Complete this stretch __________ times a day. Exercise B: Knee flexion, active  1. Lie on your back with both knees straight. If this causes back discomfort, bend your healthy knee so your foot is flat on the floor. 2. Slowly slide your left / right heel back toward your buttocks until you feel a gentle stretch in the front of your knee or thigh. 3. Hold this position for __________ seconds. 4. Slowly slide your left / right heel back to the starting  position. Repeat __________ times. Complete this exercise __________ times a day. Exercise C: Knee extension, sitting 1. Sit with your left / right heel propped on a chair, a coffee table, or a footstool. Do not have anything under your knee to support it. 2. Allow your leg muscles to relax, letting gravity straighten out your knee. Do not let your knee roll inward. You should feel a stretch behind your left / right knee. 3. If told by your health care provider, deepen the stretch by placing a __________ weight on your thigh, just above your kneecap. 4. Hold this position for __________ seconds. Repeat __________ times. Complete this stretch __________ times a day. Strengthening exercises These exercises build strength and endurance in your knee. Endurance is the ability to use your muscles for a long time, even after they get tired. Isometric exercises involve squeezing your muscles but not moving your knee. Exercise D: Quadriceps, isometric  1. Lie on your back with your left / right leg extended and your other knee bent. 2. If told by your health care provider, put a rolled towel or small pillow under your left / right knee. 3. Slowly tense the muscles in the front of your left / right thigh by pushing your knee down. You should see your kneecap slide up toward your hip or see increased dimpling just above the knee. 4. For __________ seconds, keep the muscle as tight as you can without increasing your pain. 5. Relax your muscles slowly and completely. Repeat  __________ times. Complete this exercise __________ times a day. Exercise E: Hamstring, isometric  1. Lie on your back on a firm surface. 2. Bend your left / right knee about __________ degrees. You can prop your knee on a pillow if needed. 3. Dig your heel down and back into the surface as if you are trying to pull your heel toward your buttocks. Tighten the muscles in the back of your thighs to "dig" as hard as you can without  increasing any pain. 4. Hold this position for __________ seconds. 5. Relax your muscles slowly and completely. Repeat __________ times. Complete this exercise __________ times a day. This information is not intended to replace advice given to you by your health care provider. Make sure you discuss any questions you have with your health care provider. Document Released: 12/17/2005 Document Revised: 08/23/2016 Document Reviewed: 10/29/2015 Elsevier Interactive Patient Education  2018 [employer].    IF you received an x-ray today, you will receive an invoice from [facility]. Please contact [facility] at [phone] with questions or concerns regarding your invoice.   IF you received labwork today, you will receive an invoice from [facility]. Please contact [facility] at [phone] with questions or concerns regarding your invoice.   Our billing staff will not be able to assist you with questions regarding bills from these companies.  You will be contacted with the lab results as soon as they are available. The fastest way to get your results is to activate your My Chart account. Instructions are located on the last page of this paperwork. If you have not heard from [place] regarding the results in 2 weeks, please contact this office.

## 2017-06-08 NOTE — Progress Notes (Signed)
By signing my name below, I, [name], attest that this documentation has been prepared under the direction and in the presence of [name], MD.  Electronically Signed: [name], Medical Scribe. 06/08/17. 2:21 PM.  Subjective:    Patient ID: Cameron Perez, male    DOB: [birth year], 51 y.o.   MRN: [id number]  HPI Chief Complaint  Patient presents with  . Knee Pain    x 2 weeks from a box falling on knee ( right)    HPI Comments: Cameron Perez is a 51 y.o. male who presents to Primary Care at [facility] complaining of right knee pain onset 3 weeks ago after a box full of books fell on his knee. The box pushed his lateral knee inward while his foot was planted at the opposite angle and he heard a snap. Initially there was no pian, but woke up at 3am in pain recently. Reports associated sxs of some swelling (not currently), his knee "catching" when he keeps his leg still, but is fine when he's active, and "cracking" noise when moving it. Pt has been taking advil, ice, been wearing a compression sleeve and elevating his knee at night for relief of his sxs. Pt has never had a knee injury in the past and continues to golf and participate in Jiu-Jitsu.   Patient Active Problem List   Diagnosis Date Noted  . Special screening for malignant neoplasms, colon 09/21/2014  . 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 Thyroid cancer ([place]) 02/09/2014  . 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 History of kidney stones 2012  . Kidney stones   . Thyroid cancer ([place]) 2015  . Thyroid disease    Past Surgical History:  Procedure Laterality Date  . LITHOTRIPSY  2011  . THYROIDECTOMY N/A 02/09/2014   Procedure: THYROIDECTOMY;  Surgeon: [name], MD;  Location: [facility];  Service: General;  Laterality: N/A;  . TIBIA FRACTURE SURGERY Left    leg  . VASECTOMY     No Known Allergies Prior to Admission medications     Medication Sig Start Date End Date Taking? Authorizing Provider  amLODipine (NORVASC) 5 MG tablet Take 1 tablet (5 mg total) by mouth daily. 04/01/17  Yes [name], MD  amoxicillin-clavulanate (AUGMENTIN) 875-125 MG tablet Take 1 tablet by mouth 2 (two) times daily. 06/04/17  Yes McVey, Gelene Mink, PA-C  b complex vitamins capsule Take 1 capsule by mouth daily.   Yes [provider]  CIALIS 20 MG tablet  12/17/14  Yes [provider]  clomiPHENE (CLOMID) 50 MG tablet Take 50 mg by mouth daily. Takes 1/2 tablet daily 01/01/14  Yes [provider]  Cyanocobalamin (VITAMIN B 12) 100 MCG LOZG Take by mouth.   Yes [provider]  levothyroxine (SYNTHROID, LEVOTHROID) 150 MCG tablet TAKE 1 TABLET BY MOUTH  DAILY BEFORE BREAKFAST 12/14/16  Yes [name], MD  Misc Natural Products (OSTEO BI-FLEX JOINT SHIELD PO) Take by mouth.   Yes [provider]  Potassium 99 MG TABS Take by mouth.   Yes [provider]  promethazine-dextromethorphan (PROMETHAZINE-DM) 6.25-15 MG/5ML syrup Take 5 mLs by mouth 4 (four) times daily as needed for cough. 06/04/17  Yes McVey, Gelene Mink, PA-C   Social History   Social History  . Marital status: Married    Spouse name: N/A  . Number of children: 5  . Years of education: N/A   Occupational History  .  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 Smoking status: Former Smoker    Types: Cigarettes    Quit date: 06/30/2003  . Smokeless tobacco: Former Systems developer    Types: Chew    Quit date: 12/31/2006  . Alcohol use 0.6 oz/week    1 Cans of beer per week     Comment: weekly  . Drug use: No     Comment: CHEWS NICOTINE GUM  . Sexual activity: Yes   Other Topics Concern  . Not on file   Social History Narrative   Married   Education: College   Exercise: Yes   Review of Systems  Musculoskeletal: Positive for arthralgias and joint swelling.   Objective:  Physical Exam   Constitutional: He appears well-developed and well-nourished. No distress.  HENT:  Head: Normocephalic and atraumatic.  Eyes: Conjunctivae are normal.  Neck: Neck supple.  Cardiovascular: Normal rate.   Pulmonary/Chest: Effort normal.  Musculoskeletal:  Left knee: no appreciable effusion Skins intact No erythema No ecchymosis Full flexion and full extension Patella and patellar tendon non tender Slight tenderness along the medial joint line Slightly tender along the MCL Possible slight increase laxity on valgus with partial flexion Appears equal with complete extension Negative varus Negative McMurray Negative anterior drawer Negative sulcus  Neurological: He is alert.  Skin: Skin is warm and dry.  Psychiatric: He has a normal mood and affect. His behavior is normal.  Nursing note and vitals reviewed.   Vitals:   06/08/17 1417  BP: 113/76  Pulse: 89  Resp: 18  Temp: 97.5 F (36.4 C)  TempSrc: Oral  SpO2: 98%  Weight: 183 lb (83 kg)  Height: 5' 9.69" (1.77 m)  Body mass index is 26.5 kg/m.   [name] Complete 4 Views Right  Result Date: 06/08/2017 CLINICAL DATA:  Right knee pain for 3 weeks since a box of looks fell on the patient's knee. Initial encounter. EXAM: RIGHT KNEE - COMPLETE 4+ VIEW COMPARISON:  None. FINDINGS: No evidence of fracture, dislocation, or joint effusion. No evidence of arthropathy or other focal bone abnormality. Soft tissues are unremarkable. IMPRESSION: Normal exam. Electronically Signed   By: Inge Rise M.D.   On: 06/08/2017 15:01   Assessment & Plan:   Cameron Perez is a 51 y.o. male Acute pain of right knee - Plan: [name] Complete 4 Views Right, Apply knee sleeve  Tear of MCL (medial collateral ligament) of knee, right, initial encounter - Plan: Apply knee sleeve  History/mechanism of injury and location of pain without effusion suspicious for MCL strain, but differential includes meniscus tear.. He has been able to continue  exercise, activity with minimal symptoms, but notices more discomfort at night.  -  Will initially try hinged knee brace, relative rest for the next 1-2 weeks, then slowly advance activity. Recheck in the next 3-4 weeks to determine if advanced imaging with MRI or orthopedic evaluation needed.  No orders of the defined types were placed in this encounter.  Patient Instructions    I am suspicious for possible MCL strain, but meniscus tear also possible.  Try the hinged brace, adjust activity/twisting for next 2 weeks then slowly increase as tolerated. Recheck in 3-4 weeks. Sooner if worse.   Medial Collateral Knee Ligament Sprain, Phase I Rehab Ask your health care provider which exercises are safe for you. Do exercises exactly as told by your health care provider and adjust them as directed. It is normal to feel  mild stretching, pulling, tightness, or discomfort as you do these exercises, but you should stop right away if you feel sudden pain or your pain gets worse.Do not begin these exercises until told by your health care provider. Stretching and range of motion exercises These exercises warm up your muscles and joints and improve the movement and flexibility of your knee. These exercises also help to relieve pain, numbness, and tingling. Exercise A: Knee flexion, passive 1. Start this exercise in one of these positions: ? Lying on the floor in front of an open doorway, with your left / right heel and foot lightly touching the wall. ? Lying on the floor with both feet on the wall. 2. Without using any effort, allow gravity to let your foot slide down the wall slowly until you feel a gentle stretch in the front of your left / right knee. 3. Hold this stretch for __________ seconds. 4. Return your leg to the starting position, using your healthy leg to do the work or to help if needed. Repeat __________ times. Complete this stretch __________ times a day. Exercise B: Knee flexion,  active  1. Lie on your back with both knees straight. If this causes back discomfort, bend your healthy knee so your foot is flat on the floor. 2. Slowly slide your left / right heel back toward your buttocks until you feel a gentle stretch in the front of your knee or thigh. 3. Hold this position for __________ seconds. 4. Slowly slide your left / right heel back to the starting position. Repeat __________ times. Complete this exercise __________ times a day. Exercise C: Knee extension, sitting 1. Sit with your left / right heel propped on a chair, a coffee table, or a footstool. Do not have anything under your knee to support it. 2. Allow your leg muscles to relax, letting gravity straighten out your knee. Do not let your knee roll inward. You should feel a stretch behind your left / right knee. 3. If told by your health care provider, deepen the stretch by placing a __________ weight on your thigh, just above your kneecap. 4. Hold this position for __________ seconds. Repeat __________ times. Complete this stretch __________ times a day. Strengthening exercises These exercises build strength and endurance in your knee. Endurance is the ability to use your muscles for a long time, even after they get tired. Isometric exercises involve squeezing your muscles but not moving your knee. Exercise D: Quadriceps, isometric  1. Lie on your back with your left / right leg extended and your other knee bent. 2. If told by your health care provider, put a rolled towel or small pillow under your left / right knee. 3. Slowly tense the muscles in the front of your left / right thigh by pushing your knee down. You should see your kneecap slide up toward your hip or see increased dimpling just above the knee. 4. For __________ seconds, keep the muscle as tight as you can without increasing your pain. 5. Relax your muscles slowly and completely. Repeat __________ times. Complete this exercise __________ times  a day. Exercise E: Hamstring, isometric  1. Lie on your back on a firm surface. 2. Bend your left / right knee about __________ degrees. You can prop your knee on a pillow if needed. 3. Dig your heel down and back into the surface as if you are trying to pull your heel toward your buttocks. Tighten the muscles in the back of your thighs to "  dig" as hard as you can without increasing any pain. 4. Hold this position for __________ seconds. 5. Relax your muscles slowly and completely. Repeat __________ times. Complete this exercise __________ times a day. This information is not intended to replace advice given to you by your health care provider. Make sure you discuss any questions you have with your health care provider. Document Released: 12/17/2005 Document Revised: 08/23/2016 Document Reviewed: 10/29/2015 Elsevier Interactive Patient Education  2018 [employer].    IF you received an x-ray today, you will receive an invoice from [facility]. Please contact [facility] at [phone] with questions or concerns regarding your invoice.   IF you received labwork today, you will receive an invoice from [facility]. Please contact [facility] at [phone] with questions or concerns regarding your invoice.   Our billing staff will not be able to assist you with questions regarding bills from these companies.  You will be contacted with the lab results as soon as they are available. The fastest way to get your results is to activate your My Chart account. Instructions are located on the last page of this paperwork. If you have not heard from [place] regarding the results in 2 weeks, please contact this office.       I personally performed the services described in this documentation, which was scribed in my presence. The recorded information has been reviewed and considered for accuracy and completeness, addended by me as needed, and agree with information above.  Signed,    [name], MD Primary Care at [place].  06/08/17 3:52 PM

## 2017-10-03 DIAGNOSIS — E291 Testicular hypofunction: Secondary | ICD-10-CM | POA: Diagnosis not present

## 2017-10-10 DIAGNOSIS — E291 Testicular hypofunction: Secondary | ICD-10-CM | POA: Diagnosis not present

## 2017-10-12 ENCOUNTER — Other Ambulatory Visit: Payer: Self-pay | Admitting: Endocrinology

## 2017-10-18 ENCOUNTER — Encounter: Payer: Self-pay | Admitting: Family Medicine

## 2017-10-19 ENCOUNTER — Telehealth: Payer: Self-pay | Admitting: Family Medicine

## 2017-10-19 NOTE — Telephone Encounter (Signed)
Received call from patient looking to see if the mychart message to Dr. Carlota Raspberry had been fufilled. Pt. Was looking for a 30 supply of amLODipine as his optum rx script was going to be late. Contacted [place] CMA and was advised to tell the pt. That he needed an appointment to get any prescription. Scheduled him for Monday 22 with Dr. Carlota Raspberry

## 2017-10-21 ENCOUNTER — Encounter: Payer: Self-pay | Admitting: Family Medicine

## 2017-10-21 ENCOUNTER — Ambulatory Visit (INDEPENDENT_AMBULATORY_CARE_PROVIDER_SITE_OTHER): Payer: 59 | Admitting: Family Medicine

## 2017-10-21 ENCOUNTER — Other Ambulatory Visit: Payer: Self-pay

## 2017-10-21 VITALS — BP 124/86 | HR 76 | Temp 98.5°F | Resp 18 | Ht 69.59 in | Wt 186.8 lb

## 2017-10-21 DIAGNOSIS — Z23 Encounter for immunization: Secondary | ICD-10-CM

## 2017-10-21 DIAGNOSIS — I10 Essential (primary) hypertension: Secondary | ICD-10-CM

## 2017-10-21 MED ORDER — AMLODIPINE BESYLATE 5 MG PO TABS: 5.0000 mg | ORAL_TABLET | Freq: Every day | ORAL | 0 refills | Status: DC

## 2017-10-21 MED ORDER — AMLODIPINE BESYLATE 5 MG PO TABS: 5.0000 mg | ORAL_TABLET | Freq: Every day | ORAL | 1 refills | Status: DC

## 2017-10-21 NOTE — Progress Notes (Signed)
Subjective:    Patient ID: Chief Walkup, male    DOB: 09/09/1966, 51 y.o.   MRN: [id number]   I,Amoya Bennett,acting as a scribe for [name], MD.,have documented all relevant documentation on the behalf of Kylyn Mcdade R, MD,as directed by  [name], MD while in the presence of [name], MD.   Chief Complaint  Patient presents with  . Medication Refill    BP meds    HPI Gawain Crombie is a 51 y.o. male who presents to Primary Care at [facility] for Medication Refill  Pertinent negatives include no abdominal pain, chest pain, coughing, fatigue, fever or headaches.   He is here for follow up of hypertension. Lab Results  Component Value Date   CREATININE 0.93 10/08/2016  He had his last labs on 10/08/16. Today he denies fever, dizziness or lightheadedness or LE swelling. He occasionally takes potassium supplement and eats potassium in his diet with bananas. He notes receiving a refill for his Amlodipine today. He denies side effects with his Amlodipine or other HTN medication. He has not taken Amlodipine since running out 3 days ago. He notes his BP is stable and controlled when he checks at home.   Hypothyroidism  Lab Results  Component Value Date   TSH 1.54 01/08/2017  He is being followed by Dr. Dwyane Dee. He had thyroidectomy for papulary thyroid carcinoma in 01/2014. He was seen most recently in 12/2016. Today he denies weight changes, hair changes or feeling hot from his hypothyroidism. He exercises 3-4 days a week and remains active. He has had a flu shot for this year.    Patient Active Problem List   Diagnosis Date Noted  . Special screening for malignant neoplasms, colon 09/21/2014  . 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 Thyroid cancer ([place]) 02/09/2014  . 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 History of kidney stones 2012  . Kidney stones   . Thyroid cancer ([place]) 2015  . Thyroid  disease    Past Surgical History:  Procedure Laterality Date  . LITHOTRIPSY  2011  . THYROIDECTOMY N/A 02/09/2014   Procedure: THYROIDECTOMY;  Surgeon: [name], MD;  Location: [facility];  Service: General;  Laterality: N/A;  . TIBIA FRACTURE SURGERY Left    leg  . VASECTOMY     No Known Allergies Prior to Admission medications   Medication Sig Start Date End Date Taking? Authorizing Provider  amLODipine (NORVASC) 5 MG tablet Take 1 tablet (5 mg total) by mouth daily. 10/21/17  Yes [name], MD  amoxicillin-clavulanate (AUGMENTIN) 875-125 MG tablet Take 1 tablet by mouth 2 (two) times daily. 06/04/17  Yes McVey, Gelene Mink, PA-C  b complex vitamins capsule Take 1 capsule by mouth daily.   Yes [provider]  CIALIS 20 MG tablet  12/17/14  Yes [provider]  clomiPHENE (CLOMID) 50 MG tablet Take 50 mg by mouth daily. Takes 1/2 tablet daily 01/01/14  Yes [provider]  Cyanocobalamin (VITAMIN B 12) 100 MCG LOZG Take by mouth.   Yes [provider]  levothyroxine (SYNTHROID, LEVOTHROID) 150 MCG tablet TAKE 1 TABLET BY MOUTH  DAILY BEFORE BREAKFAST 10/13/17  Yes [name], MD  Misc Natural Products (OSTEO BI-FLEX JOINT SHIELD PO) Take by mouth.   Yes [provider]  Potassium 99 MG TABS Take by mouth.   Yes [provider]  promethazine-dextromethorphan (PROMETHAZINE-DM) 6.25-15 MG/5ML syrup Take 5 mLs by mouth 4 (four)  times daily as needed for cough. 06/04/17  Yes McVey, Gelene Mink, PA-C   Social History   Social History  . Marital status: Married    Spouse name: N/A  . Number of children: 5  . Years of education: N/A   Occupational History  . 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 Smoking status: Former Smoker    Types: Cigarettes    Quit date: 06/30/2003  . Smokeless tobacco: Former Systems developer    Types: Chew    Quit date: 12/31/2006  . Alcohol use 0.6 oz/week    1  Cans of beer per week     Comment: weekly  . Drug use: No     Comment: CHEWS NICOTINE GUM  . Sexual activity: Yes   Other Topics Concern  . Not on file   Social History Narrative   Married   Education: College   Exercise: Yes    Review of Systems  Constitutional: Negative for fatigue, fever and unexpected weight change.       Negative for hair changes  Eyes: Negative for visual disturbance.  Respiratory: Negative for cough, chest tightness and shortness of breath.   Cardiovascular: Negative for chest pain, palpitations and leg swelling.  Gastrointestinal: Negative for abdominal pain and blood in stool.  Skin:       Negative for skin changes  Neurological: Negative for dizziness, light-headedness and headaches.  All other systems reviewed and are negative.      Objective:   Physical Exam  Constitutional: He is oriented to person, place, and time. He appears well-developed and well-nourished.  HENT:  Head: Normocephalic and atraumatic.  Eyes: Pupils are equal, round, and reactive to light. EOM are normal.  Neck: No JVD present. Carotid bruit is not present.  Cardiovascular: Normal rate, regular rhythm and normal heart sounds.   No murmur heard. Pulmonary/Chest: Effort normal and breath sounds normal. He has no rales.  Musculoskeletal: He exhibits no edema.  Neurological: He is alert and oriented to person, place, and time.  Skin: Skin is warm and dry.  Psychiatric: He has a normal mood and affect.  Vitals reviewed.  Vitals:   10/21/17 1420  BP: 124/86  Pulse: 76  Resp: 18  Temp: 98.5 F (36.9 C)  TempSrc: Oral  SpO2: 96%  Weight: 186 lb 12.8 oz (84.7 kg)  Height: 5' 9.59" (1.768 m)      Assessment & Plan:   Bowden Boody is a 51 y.o. male Essential hypertension - Plan: amLODipine (NORVASC) 5 MG tablet, CANCELED: Basic metabolic panel  Need for influenza vaccination - Plan: Flu Vaccine QUAD 36+ mos IM  Overall stable BP. No new side effects of meds.  Continue amlodipine same dose. Will return for fasting labs this week. Recheck in 6 months for physical.    Meds ordered this encounter  Medications  . amLODipine (NORVASC) 5 MG tablet    Sig: Take 1 tablet (5 mg total) by mouth daily.    Dispense:  90 tablet    Refill:  1   Patient Instructions    No change in meds for now,. Recheck in 6 months for a physical, but return later this week for fasting lab visit only (8 hours fasting). Let me know if there are questions in the meantime.     IF you received an x-ray today, you will receive an invoice from [facility]. Please contact [facility] at [phone] with questions or concerns regarding your invoice.  IF you received labwork today, you will receive an invoice from [place]. Please contact [facility] at [phone] with questions or concerns regarding your invoice.   Our billing staff will not be able to assist you with questions regarding bills from these companies.  You will be contacted with the lab results as soon as they are available. The fastest way to get your results is to activate your My Chart account. Instructions are located on the last page of this paperwork. If you have not heard from [place] regarding the results in 2 weeks, please contact this office.       I personally performed the services described in this documentation, which was scribed in my presence. The recorded information has been reviewed and considered for accuracy and completeness, addended by me as needed, and agree with information above.  Signed,   [name], MD Primary Care at [place].  10/21/17 3:19 PM

## 2017-10-21 NOTE — Patient Instructions (Addendum)
  No change in meds for now,. Recheck in 6 months for a physical, but return later this week for fasting lab visit only (8 hours fasting). Let me know if there are questions in the meantime.     IF you received an x-ray today, you will receive an invoice from [facility]. Please contact [facility] at [phone] with questions or concerns regarding your invoice.   IF you received labwork today, you will receive an invoice from [place]. Please contact [facility] at [phone] with questions or concerns regarding your invoice.   Our billing staff will not be able to assist you with questions regarding bills from these companies.  You will be contacted with the lab results as soon as they are available. The fastest way to get your results is to activate your My Chart account. Instructions are located on the last page of this paperwork. If you have not heard from [place] regarding the results in 2 weeks, please contact this office.

## 2017-10-25 ENCOUNTER — Ambulatory Visit (INDEPENDENT_AMBULATORY_CARE_PROVIDER_SITE_OTHER): Payer: 59 | Admitting: Family Medicine

## 2017-10-25 DIAGNOSIS — I10 Essential (primary) hypertension: Secondary | ICD-10-CM | POA: Diagnosis not present

## 2017-10-25 NOTE — Progress Notes (Signed)
Labs only

## 2017-10-26 LAB — COMPREHENSIVE METABOLIC PANEL
ALK PHOS: 114 IU/L (ref 39–117)
ALT: 37 IU/L (ref 0–44)
AST: 20 IU/L (ref 0–40)
Albumin/Globulin Ratio: 2.1 (ref 1.2–2.2)
Albumin: 4.2 g/dL (ref 3.5–5.5)
BILIRUBIN TOTAL: 0.5 mg/dL (ref 0.0–1.2)
BUN/Creatinine Ratio: 16 (ref 9–20)
BUN: 14 mg/dL (ref 6–24)
CO2: 24 mmol/L (ref 20–29)
Calcium: 8.4 mg/dL — ABNORMAL LOW (ref 8.7–10.2)
Chloride: 103 mmol/L (ref 96–106)
Creatinine, Ser: 0.9 mg/dL (ref 0.76–1.27)
GFR, EST AFRICAN AMERICAN: 113 mL/min/{1.73_m2} (ref >59)
GFR, EST NON AFRICAN AMERICAN: 98 mL/min/{1.73_m2} (ref >59)
GLUCOSE: 99 mg/dL (ref 65–99)
Globulin, Total: 2 g/dL (ref 1.5–4.5)
Potassium: 3.7 mmol/L (ref 3.5–5.2)
Sodium: 143 mmol/L (ref 134–144)
Total Protein: 6.2 g/dL (ref 6.0–8.5)

## 2017-10-26 LAB — LIPID PANEL
CHOL/HDL RATIO: 4.1 ratio (ref 0.0–5.0)
Cholesterol, Total: 189 mg/dL (ref 100–199)
HDL: 46 mg/dL (ref >39)
LDL Calculated: 124 mg/dL — ABNORMAL HIGH (ref 0–99)
TRIGLYCERIDES: 96 mg/dL (ref 0–149)
VLDL CHOLESTEROL CAL: 19 mg/dL (ref 5–40)

## 2018-01-07 ENCOUNTER — Other Ambulatory Visit (INDEPENDENT_AMBULATORY_CARE_PROVIDER_SITE_OTHER): Payer: 59

## 2018-01-07 DIAGNOSIS — C73 Malignant neoplasm of thyroid gland: Secondary | ICD-10-CM

## 2018-01-07 LAB — T4, FREE: Free T4: 1.26 ng/dL (ref 0.60–1.60)

## 2018-01-07 LAB — TSH: TSH: 3.45 u[IU]/mL (ref 0.35–4.50)

## 2018-01-09 LAB — TGAB+THYROGLOBULIN IMA OR LCMS: Thyroglobulin Antibody: <1 IU/mL (ref 0.0–0.9)

## 2018-01-09 LAB — THYROGLOBULIN BY IMA: Thyroglobulin by IMA: 3.5 ng/mL (ref 1.4–29.2)

## 2018-01-10 ENCOUNTER — Ambulatory Visit: Payer: 59 | Admitting: Endocrinology

## 2018-01-10 ENCOUNTER — Encounter: Payer: Self-pay | Admitting: Endocrinology

## 2018-01-10 VITALS — BP 122/74 | HR 74 | Ht 69.5 in | Wt 182.0 lb

## 2018-01-10 DIAGNOSIS — E890 Postprocedural hypothyroidism: Secondary | ICD-10-CM

## 2018-01-10 DIAGNOSIS — C73 Malignant neoplasm of thyroid gland: Secondary | ICD-10-CM | POA: Diagnosis not present

## 2018-01-10 NOTE — Progress Notes (Signed)
Patient ID: Cameron Perez, male   DOB: [birth year], 52 y.o.   MRN: [id number]   Reason for Appointment:  Followup of thyroid   History of Present Illness:   THYROID cancer: He had thyroidectomy for his papillary thyroid carcinoma in 01/2014 Surgical pathology showed the following: PAPILLARY THYROID CARCINOMA, TWO FOCI, 1.7 CM RIGHT LOBE AND 0.3 CM LEFT LOBE, margins not involved. - FOCAL EXTRATHYROID EXTENSION.  Margins: Free of tumor.  Lymph - Vascular invasion: Present. 1 positive lymph node  TREATMENT: He had 76 mCi of I-131 for remnant ablation on 03/29/14 His post therapy body scan showed 3 foci of uptake in the neck with possible uptake in one lymph node  Monitoring:   Follow-up whole-body scan in 06/2016 did not show any activity His thyroglobulin level in 10/15 was relatively high at 3.6, previously 2.9 The level was improved  at 0.9 in 05/2015 More recently his thyroglobulin is 3.5 compared to 1.9 in 1/18, however TSH is relatively higher at 3.5  Thyroid ultrasound in 12/2016 showed small nodular structures on the right side without any growth and a 0.3 cm nodular structure in the left thyroid bed  HYPOTHYROIDISM He thinks that he is having some more fatigue especially in the afternoon in the last few months  He has been on levothyroxine which he has been taking regularly in the morning about half hour before breakfast Not taking any calcium   Continues to be taking 150 g   TSH is now 3.5 compared to 1.5 previously    Lab Results  Component Value Date   FREET4 1.26 01/07/2018   FREET4 1.19 01/08/2017   FREET4 0.94 07/05/2014   TSH 3.45 01/07/2018   TSH 1.54 01/08/2017   TSH 0.66 06/29/2016    Lab Results  Component Value Date   THYROGLB 3.5 01/07/2018   THYROGLB 1.9 01/08/2017   THYROGLB 2.3 06/29/2016   THYROGLB <2.0 10/31/2015      Allergies as of 01/10/2018   No Known Allergies     Medication List        Accurate as of 01/10/18 12:08  PM. Always use your most recent med list.          amLODipine 5 MG tablet Commonly known as:  NORVASC Take 1 tablet (5 mg total) by mouth daily.   b complex vitamins capsule Take 1 capsule by mouth daily.   CIALIS 20 MG tablet Generic drug:  tadalafil   clomiPHENE 50 MG tablet Commonly known as:  CLOMID Take 50 mg by mouth daily. Takes 1/2 tablet daily   levothyroxine 150 MCG tablet Commonly known as:  SYNTHROID, LEVOTHROID TAKE 1 TABLET BY MOUTH  DAILY BEFORE BREAKFAST   OSTEO BI-FLEX JOINT SHIELD PO Take by mouth.   Potassium 99 MG Tabs Take by mouth.   Vitamin B 12 100 MCG Lozg Take by mouth.       Allergies: No Known Allergies  Past Medical History:  Diagnosis Date  . History of kidney stones 2012  . Kidney stones   . Thyroid cancer ([place]) 2015  . Thyroid disease     Past Surgical History:  Procedure Laterality Date  . LITHOTRIPSY  2011  . THYROIDECTOMY N/A 02/09/2014   Procedure: THYROIDECTOMY;  Surgeon: [name], MD;  Location: [facility];  Service: General;  Laterality: N/A;  . TIBIA FRACTURE SURGERY Left    leg  . VASECTOMY      Family History  Problem Relation Age of Onset  .  Breast cancer Mother   . Cancer Mother        Breast  . Hypertension Mother   . Breast cancer Maternal Grandmother   . Cancer Maternal Grandmother   . Diabetes Paternal Grandfather   . Hyperlipidemia Paternal Grandfather   . Colon cancer Neg Hx     Social History:  reports that he quit smoking about 14 years ago. His smoking use included cigarettes. He quit smokeless tobacco use about 11 years ago. His smokeless tobacco use included chew. He reports that he drinks about 0.6 oz of alcohol per week. He reports that he does not use drugs.   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   Not clear what his diagnosis is and no records are available His prolactin level Was  normal  EXAM:  BP 122/74   Pulse 74   Ht 5' 9.5" (1.765 m)   Wt 182 lb (82.6 kg)   SpO2 98%   BMI 26.49 kg/m   Neck exam shows no palpable abnormality in the thyroid bed  No cervical lymphadenopathy Right biceps reflexes normal   Assessment/Plan:  Hypothyroidism:  Subjectively complaining of fatigue now He has been compliant with taking his levothyroxine daily before breakfast His TSH is high normal with current regimen of 150 mcg daily  Thyroid cancer: History of 1.7 cm papillary thyroid carcinoma with extrathyroidal extension and negative margins   His thyroglobulin is persistently high but has been variable ranging from 0.6 up to 3.6 since 09/2014 It is now still stable at a relatively low level of 3.5  Previously his nuclear scan was negative and there was no evidence of any tumor on his ultrasound done in 2018  Since his thyroglobulin is relatively higher will repeat his nuclear scan  Plan and previous studies discussed with patient    [name] 01/10/2018

## 2018-01-10 NOTE — Patient Instructions (Signed)
8 pills a week

## 2018-02-10 ENCOUNTER — Encounter (HOSPITAL_COMMUNITY)
Admission: RE | Admit: 2018-02-10 | Discharge: 2018-02-10 | Disposition: A | Discharge status: Home / Self Care | Payer: 59 | Source: Ambulatory Visit | Attending: Endocrinology | Admitting: Endocrinology

## 2018-02-10 DIAGNOSIS — C73 Malignant neoplasm of thyroid gland: Secondary | ICD-10-CM | POA: Insufficient documentation

## 2018-02-10 MED ORDER — THYROTROPIN ALFA 1.1 MG IM SOLR
INTRAMUSCULAR | Status: AC
  Filled 2018-02-10: qty 0.9

## 2018-02-10 MED ORDER — THYROTROPIN ALFA 1.1 MG IM SOLR
0.9000 mg | INTRAMUSCULAR | Status: AC
  Administered 2018-02-10: 0.9 mg via INTRAMUSCULAR

## 2018-02-11 ENCOUNTER — Encounter (HOSPITAL_COMMUNITY)
Admission: RE | Admit: 2018-02-11 | Discharge: 2018-02-11 | Disposition: A | Discharge status: Home / Self Care | Payer: 59 | Source: Ambulatory Visit | Attending: Endocrinology | Admitting: Endocrinology

## 2018-02-11 DIAGNOSIS — C73 Malignant neoplasm of thyroid gland: Secondary | ICD-10-CM | POA: Diagnosis not present

## 2018-02-11 MED ORDER — THYROTROPIN ALFA 1.1 MG IM SOLR
0.9000 mg | INTRAMUSCULAR | Status: AC
  Administered 2018-02-11: 0.9 mg via INTRAMUSCULAR

## 2018-02-11 MED ORDER — THYROTROPIN ALFA 1.1 MG IM SOLR
INTRAMUSCULAR | Status: AC
  Filled 2018-02-11: qty 0.9

## 2018-02-12 ENCOUNTER — Encounter (HOSPITAL_COMMUNITY)
Admission: RE | Admit: 2018-02-12 | Discharge: 2018-02-12 | Disposition: A | Discharge status: Home / Self Care | Payer: 59 | Source: Ambulatory Visit | Attending: Endocrinology | Admitting: Endocrinology

## 2018-02-12 MED ORDER — SODIUM IODIDE I 131 CAPSULE
4.9000 | Freq: Once | INTRAVENOUS | Status: AC | PRN
  Administered 2018-02-12: 3.9 via ORAL

## 2018-02-14 ENCOUNTER — Encounter (HOSPITAL_COMMUNITY)
Admission: RE | Admit: 2018-02-14 | Discharge: 2018-02-14 | Disposition: A | Discharge status: Home / Self Care | Payer: 59 | Source: Ambulatory Visit | Attending: Endocrinology | Admitting: Endocrinology

## 2018-02-14 DIAGNOSIS — C73 Malignant neoplasm of thyroid gland: Secondary | ICD-10-CM | POA: Diagnosis not present

## 2018-02-21 ENCOUNTER — Other Ambulatory Visit: Payer: 59

## 2018-02-24 ENCOUNTER — Encounter: Payer: Self-pay | Admitting: Endocrinology

## 2018-02-26 ENCOUNTER — Other Ambulatory Visit: Payer: 59

## 2018-03-17 ENCOUNTER — Encounter: Payer: 59 | Admitting: Family Medicine

## 2018-03-27 ENCOUNTER — Ambulatory Visit (INDEPENDENT_AMBULATORY_CARE_PROVIDER_SITE_OTHER): Payer: 59 | Admitting: Family Medicine

## 2018-03-27 ENCOUNTER — Other Ambulatory Visit: Payer: Self-pay

## 2018-03-27 ENCOUNTER — Encounter: Payer: Self-pay | Admitting: Family Medicine

## 2018-03-27 VITALS — BP 132/80 | HR 73 | Temp 97.4°F | Resp 18 | Ht 69.0 in | Wt 181.2 lb

## 2018-03-27 DIAGNOSIS — Z13220 Encounter for screening for lipoid disorders: Secondary | ICD-10-CM | POA: Diagnosis not present

## 2018-03-27 DIAGNOSIS — Z125 Encounter for screening for malignant neoplasm of prostate: Secondary | ICD-10-CM | POA: Diagnosis not present

## 2018-03-27 DIAGNOSIS — E890 Postprocedural hypothyroidism: Secondary | ICD-10-CM | POA: Diagnosis not present

## 2018-03-27 DIAGNOSIS — Z23 Encounter for immunization: Secondary | ICD-10-CM | POA: Diagnosis not present

## 2018-03-27 DIAGNOSIS — Z131 Encounter for screening for diabetes mellitus: Secondary | ICD-10-CM

## 2018-03-27 DIAGNOSIS — Z0000 Encounter for general adult medical examination without abnormal findings: Secondary | ICD-10-CM | POA: Diagnosis not present

## 2018-03-27 DIAGNOSIS — E291 Testicular hypofunction: Secondary | ICD-10-CM

## 2018-03-27 DIAGNOSIS — I10 Essential (primary) hypertension: Secondary | ICD-10-CM

## 2018-03-27 MED ORDER — ZOSTER VAC RECOMB ADJUVANTED 50 MCG/0.5ML IM SUSR: 0.5000 mL | Freq: Once | INTRAMUSCULAR | 1 refills | Status: AC

## 2018-03-27 NOTE — Progress Notes (Signed)
Subjective:  By signing my name below, I, Moises Blood, attest that this documentation has been prepared under the direction and in the presence of [name], MD. Electronically Signed: Moises Blood, [place]. 03/27/2018 , 3:08 PM .  Patient was seen in Room 12 .   Patient ID: Cameron Perez, male    DOB: [birth year], 52 y.o.   MRN: [id number] Chief Complaint  Patient presents with  . Annual Exam   HPI Cameron Perez is a 52 y.o. male Here for annual physical. He had fasting blood work drawn this morning.   He recently went down to [facility] for Christmas 2018 with his family.   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 Thyroidectomy Feb 2015, then remnant ablation in March 2015, and follow up whole body scan in Nov 2015 without activity. He had visit with Dr. Dwyane Dee on Jan 11th, on levothyroxine 145mcg QD. Recent TSH 3.5 versus 1.5 previously. Recent nuclear scan on Feb 11th without evidence of residual or recurrent function of thyroid issue or metastatic disease. Initial plan of 8 pills a week of thyroid medication. Feb 25th patient email reviewed, patient had increasing jitteriness, so he went back to 7 pills per week. Plan for thyroid testing within a few weeks.   ED/low testosterone Patient mentions having progressively worsening problems with erections since mid January after starting the synthroid medication. He was previously followed by Dr. Alyson Ingles at [facility] for lowered testosterone; last seen [facility] over a year ago, but has an appointment in May. He was taking Clomid. He mentions being seen at [facility] earlier this week for blood test, but no results back yet. He's currently on daily Cialis.   HTN Patient was on amlodipine 5mg  QD, previously tolerated. He states his BP was 116/78 prior to office visit today. He last took BP medication on Monday (3 days ago).   Lab Results  Component Value  Date   CREATININE 0.90 10/25/2017   BP Readings from Last 3 Encounters:  03/27/18 132/80  01/10/18 122/74  10/21/17 124/86    Cancer Screening Colonoscopy: last done on 09/24/14, repeat 10 years.  Prostate cancer screening: When last discussed in Feb 2017, he was followed by [facility] seen every 6 months. He has history of elevated PSA in 2015 and biopsy that was negative.   Immunizations Immunization History  Administered Date(s) Administered  . Influenza,inj,Quad PF,6+ Mos 01/12/2015, 11/04/2015, 08/30/2016, 10/21/2017  . Influenza-Unspecified 11/04/2015  . Tdap 12/31/2009   Shingles vaccine: prescription sent to pharmacy.   Depression Depression screen [facility] 2/9 03/27/2018 10/21/2017 06/08/2017 06/04/2017 01/21/2017  Decreased Interest 0 0 0 0 0  Down, Depressed, Hopeless 0 0 0 0 0  PHQ - 2 Score 0 0 0 0 0     Vision  Visual Acuity Screening   Right eye Left eye Both eyes  Without correction:     With correction: 20/13 20/15 20/15    Eye doctor: last seen more than a year ago.   Dentist Saw dentist about a month ago.   Exercise Previously exercising about 4 days a week with martial arts; however recently, not so much with decreased motivation.   Lipid screening Lab Results  Component Value Date   CHOL 189 10/25/2017   HDL 46 10/25/2017   LDLCALC 124 (H) 10/25/2017   TRIG 96 10/25/2017   CHOLHDL 4.1 10/25/2017    Patient Active Problem List   Diagnosis Date Noted  . Special screening for malignant  neoplasms, colon 09/21/2014  . 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 Thyroid cancer ([place]) 02/09/2014  . Papillary adenocarcinoma of thyroid ([place]) 01/20/2014   Past Medical History:  Diagnosis Date  . History of kidney stones 2012  . Kidney stones   . Thyroid cancer ([place]) 2015  . Thyroid disease    Past Surgical History:  Procedure Laterality Date  . LITHOTRIPSY  2011  . THYROIDECTOMY N/A 02/09/2014   Procedure: THYROIDECTOMY;  Surgeon: [name], MD;  Location: [facility];  Service: General;  Laterality: N/A;  . TIBIA FRACTURE SURGERY Left    leg  . VASECTOMY     No Known Allergies Prior to Admission medications   Medication Sig Start Date End Date Taking? Authorizing Provider  amLODipine (NORVASC) 5 MG tablet Take 1 tablet (5 mg total) by mouth daily. 10/21/17   [name], MD  b complex vitamins capsule Take 1 capsule by mouth daily.    [provider]  CIALIS 20 MG tablet  12/17/14   [provider]  clomiPHENE (CLOMID) 50 MG tablet Take 50 mg by mouth daily. Takes 1/2 tablet daily 01/01/14   [provider]  Cyanocobalamin (VITAMIN B 12) 100 MCG LOZG Take by mouth.    [provider]  levothyroxine (SYNTHROID, LEVOTHROID) 150 MCG tablet TAKE 1 TABLET BY MOUTH  DAILY BEFORE BREAKFAST 10/13/17   [name], MD  Misc Natural Products (OSTEO BI-FLEX JOINT SHIELD PO) Take by mouth.    [provider]  Potassium 99 MG TABS Take by mouth.    [provider]   Social History   Socioeconomic History  . Marital status: Married    Spouse name: Not on file  . Number of children: 5  . Years of education: Not on file  . Highest education level: Not on file  Occupational History  . Occupation: Herbalist: [employer]  Social Needs  . Financial resource strain: Not on file  . Food insecurity:    Worry: Not on file    Inability: Not on file  . Transportation needs:    Medical: Not on file    Non-medical: Not on file  Tobacco Use  . Smoking status: Former Smoker    Types: Cigarettes    Last attempt to quit: 06/30/2003    Years since quitting: 14.7  . Smokeless tobacco: Former Systems developer    Types: [place] date: 12/31/2006  Substance and Sexual Activity  . Alcohol use: Yes    Alcohol/week: 0.6 oz    Types: 1 Cans of beer per week    Comment: weekly  . Drug use: No    Comment: CHEWS NICOTINE GUM  . Sexual activity: Yes  Lifestyle  .  Physical activity:    Days per week: Not on file    Minutes per session: Not on file  . Stress: Not on file  Relationships  . Social connections:    Talks on phone: Not on file    Gets together: Not on file    Attends religious service: Not on file    Active member of club or organization: Not on file    Attends meetings of clubs or organizations: Not on file    Relationship status: Not on file  . Intimate partner violence:    Fear of current or ex partner: Not on file    Emotionally abused: Not on file    Physically abused: Not on file  Forced sexual activity: Not on file  Other Topics Concern  . Not on file  Social History Narrative   Married   Education: College   Exercise: Yes   Review of Systems 13 point ROS - negative     Objective:   Physical Exam  Constitutional: He is oriented to person, place, and time. He appears well-developed and well-nourished.  HENT:  Head: Normocephalic and atraumatic.  Right Ear: External ear normal.  Left Ear: External ear normal.  Mouth/Throat: Oropharynx is clear and moist.  Eyes: Pupils are equal, round, and reactive to light. Conjunctivae and EOM are normal.  Neck: Normal range of motion. Neck supple. No thyromegaly present.  Cardiovascular: Normal rate, regular rhythm, normal heart sounds and intact distal pulses.  Pulmonary/Chest: Effort normal and breath sounds normal. No respiratory distress. He has no wheezes.  Abdominal: Soft. He exhibits no distension. There is no tenderness.  Musculoskeletal: Normal range of motion. He exhibits no edema or tenderness.  Lymphadenopathy:    He has no cervical adenopathy.  Neurological: He is alert and oriented to person, place, and time. He has normal reflexes.  Skin: Skin is warm and dry.  Psychiatric: He has a normal mood and affect. His behavior is normal.  Vitals reviewed.    Vitals:   03/27/18 1425  BP: 132/80  Pulse: 73  Resp: 18  Temp: (!) 97.4 F (36.3 C)  TempSrc: Oral    SpO2: 96%  Weight: 181 lb 3.2 oz (82.2 kg)  Height: 5\' 9"  (1.753 m)       Assessment & Plan:   Arsh Feutz is a 52 y.o. male Annual physical exam  - -anticipatory guidance as below in AVS, screening labs above. Health maintenance items as above in HPI discussed/recommended as applicable.   Screening for hyperlipidemia - Plan: Lipid panel  Screening for diabetes mellitus - Plan: Comprehensive metabolic panel  Screening for prostate cancer - Plan: PSA, total and free, CANCELED: PSA  -Check PSA with total and free, follow-up with urology for further discussion and exam  Essential hypertension  -Stable off medication.  Option of continuing to monitor off amlodipine, then if higher readings can start at half dose of 2.5 mg.  Advised to let me know either way based on medications and need for refills.  Postablative hypothyroidism - Plan: TSH + free T4  -Check labs, follow-up with endocrinology as planned  Need for shingles vaccine - Plan: Zoster Vaccine Adjuvanted [facility]) injection sent to pharmacy  Hypogonadism in male - Plan: Testosterone, Free, Total, SHBG  -Check testosterone levels, then follow-up with endocrinology or urology to discuss treatments, previously on Clomid.   Meds ordered this encounter  Medications  . Zoster Vaccine Adjuvanted [facility]) injection    Sig: Inject 0.5 mLs into the muscle once for 1 dose. Repeat in 2-6 months.    Dispense:  0.5 mL    Refill:  1   Patient Instructions   I will check testosterone level from the blood work this morning, as well as PSA,  but would recommend discussing results and treatment options with urology at upcoming visit.   Continue to monitor blood pressure off the amlodipine. If you have readings over 140/90, then restart at 1/2 pill per day. If needed, can increase up to full pill and let me know the eventual dose that works for you.    Shingles vaccine sent to your pharmacy.   Thanks for coming in today.    Keeping you healthy  Get these tests  Blood pressure- Have your blood pressure checked once a year by your healthcare provider.  Normal blood pressure is 120/80  Weight- Have your body mass index (BMI) calculated to screen for obesity.  BMI is a measure of body fat based on height and weight. You can also calculate your own BMI at ViewBanking.[email or web address].  Cholesterol- Have your cholesterol checked every year.  Diabetes- Have your blood sugar checked regularly if you have high blood pressure, high cholesterol, have a family history of diabetes or if you are overweight.  Screening for Colon Cancer- Colonoscopy starting at age 22.  Screening may begin sooner depending on your family history and other health conditions. Follow up colonoscopy as directed by your Gastroenterologist.  Screening for Prostate Cancer- Both blood work (PSA) and a rectal exam help screen for Prostate Cancer.  Screening begins at age 86 with African-American men and at age 36 with Caucasian men.  Screening may begin sooner depending on your family history.  Take these medicines  Aspirin- One aspirin daily can help prevent Heart disease and Stroke.  Flu shot- Every fall.  Tetanus- Every 10 years.  Zostavax- Once after the age of 24 to prevent Shingles.  Pneumonia shot- Once after the age of 66; if you are younger than 47, ask your healthcare provider if you need a Pneumonia shot.  Take these steps  Don't smoke- If you do smoke, talk to your doctor about quitting.  For tips on how to quit, go to www.smokefree.gov or call 1-800-QUIT-NOW.  Be physically active- Exercise 5 days a week for at least 30 minutes.  If you are not already physically active start slow and gradually work up to 30 minutes of moderate physical activity.  Examples of moderate activity include walking briskly, mowing the yard, dancing, swimming, bicycling, etc.  Eat a healthy diet- Eat a variety of healthy food such as fruits, vegetables,  low fat milk, low fat cheese, yogurt, lean meant, poultry, fish, beans, tofu, etc. For more information go to www.thenutritionsource.org  Drink alcohol in moderation- Limit alcohol intake to less than two drinks a day. Never drink and drive.  Dentist- Brush and floss twice daily; visit your dentist twice a year.  Depression- Your emotional health is as important as your physical health. If you're feeling down, or losing interest in things you would normally enjoy please talk to your healthcare provider.  Eye exam- Visit your eye doctor every year.  Safe sex- If you may be exposed to a sexually transmitted infection, use a condom.  Seat belts- Seat belts can save your life; always wear one.  Smoke/Carbon Monoxide detectors- These detectors need to be installed on the appropriate level of your home.  Replace batteries at least once a year.  Skin cancer- When out in the sun, cover up and use sunscreen 15 SPF or higher.  Violence- If anyone is threatening you, please tell your healthcare provider.  Living Will/ Health care power of attorney- Speak with your healthcare provider and family.    IF you received an x-ray today, you will receive an invoice from [facility]. Please contact [facility] at [phone] with questions or concerns regarding your invoice.   IF you received labwork today, you will receive an invoice from [facility]. Please contact [facility] at [phone] with questions or concerns regarding your invoice.   Our billing staff will not be able to assist you with questions regarding bills from these companies.  You will be contacted with the lab results as soon as  they are available. The fastest way to get your results is to activate your My Chart account. Instructions are located on the last page of this paperwork. If you have not heard from [place] regarding the results in 2 weeks, please contact this office.      I personally performed the services  described in this documentation, which was scribed in my presence. The recorded information has been reviewed and considered for accuracy and completeness, addended by me as needed, and agree with information above.  Signed,   [name], MD [facility].  03/30/18 2:40 PM

## 2018-03-27 NOTE — Patient Instructions (Addendum)
I will check testosterone level from the blood work this morning, as well as PSA,  but would recommend discussing results and treatment options with urology at upcoming visit.   Continue to monitor blood pressure off the amlodipine. If you have readings over 140/90, then restart at 1/2 pill per day. If needed, can increase up to full pill and let me know the eventual dose that works for you.    Shingles vaccine sent to your pharmacy.   Thanks for coming in today.   Keeping you healthy  Get these tests  Blood pressure- Have your blood pressure checked once a year by your healthcare provider.  Normal blood pressure is 120/80  Weight- Have your body mass index (BMI) calculated to screen for obesity.  BMI is a measure of body fat based on height and weight. You can also calculate your own BMI at ViewBanking.si.  Cholesterol- Have your cholesterol checked every year.  Diabetes- Have your blood sugar checked regularly if you have high blood pressure, high cholesterol, have a family history of diabetes or if you are overweight.  Screening for Colon Cancer- Colonoscopy starting at age 67.  Screening may begin sooner depending on your family history and other health conditions. Follow up colonoscopy as directed by your Gastroenterologist.  Screening for Prostate Cancer- Both blood work (PSA) and a rectal exam help screen for Prostate Cancer.  Screening begins at age 44 with African-American men and at age 32 with Caucasian men.  Screening may begin sooner depending on your family history.  Take these medicines  Aspirin- One aspirin daily can help prevent Heart disease and Stroke.  Flu shot- Every fall.  Tetanus- Every 10 years.  Zostavax- Once after the age of 1 to prevent Shingles.  Pneumonia shot- Once after the age of 61; if you are younger than 82, ask your healthcare provider if you need a Pneumonia shot.  Take these steps  Don't smoke- If you do smoke, talk to your  doctor about quitting.  For tips on how to quit, go to www.smokefree.gov or call 1-800-QUIT-NOW.  Be physically active- Exercise 5 days a week for at least 30 minutes.  If you are not already physically active start slow and gradually work up to 30 minutes of moderate physical activity.  Examples of moderate activity include walking briskly, mowing the yard, dancing, swimming, bicycling, etc.  Eat a healthy diet- Eat a variety of healthy food such as fruits, vegetables, low fat milk, low fat cheese, yogurt, lean meant, poultry, fish, beans, tofu, etc. For more information go to www.thenutritionsource.org  Drink alcohol in moderation- Limit alcohol intake to less than two drinks a day. Never drink and drive.  Dentist- Brush and floss twice daily; visit your dentist twice a year.  Depression- Your emotional health is as important as your physical health. If you're feeling down, or losing interest in things you would normally enjoy please talk to your healthcare provider.  Eye exam- Visit your eye doctor every year.  Safe sex- If you may be exposed to a sexually transmitted infection, use a condom.  Seat belts- Seat belts can save your life; always wear one.  Smoke/Carbon Monoxide detectors- These detectors need to be installed on the appropriate level of your home.  Replace batteries at least once a year.  Skin cancer- When out in the sun, cover up and use sunscreen 15 SPF or higher.  Violence- If anyone is threatening you, please tell your healthcare provider.  Living Will/ Health care  power of attorney- Speak with your healthcare provider and family.    IF you received an x-ray today, you will receive an invoice from [facility]. Please contact [facility] at [phone] with questions or concerns regarding your invoice.   IF you received labwork today, you will receive an invoice from [facility]. Please contact [facility] at [phone] with questions or concerns  regarding your invoice.   Our billing staff will not be able to assist you with questions regarding bills from these companies.  You will be contacted with the lab results as soon as they are available. The fastest way to get your results is to activate your My Chart account. Instructions are located on the last page of this paperwork. If you have not heard from [place] regarding the results in 2 weeks, please contact this office.

## 2018-03-28 LAB — COMPREHENSIVE METABOLIC PANEL
ALK PHOS: 100 IU/L (ref 39–117)
ALT: 27 IU/L (ref 0–44)
AST: 17 IU/L (ref 0–40)
Albumin/Globulin Ratio: 2 (ref 1.2–2.2)
Albumin: 4.4 g/dL (ref 3.5–5.5)
BUN/Creatinine Ratio: 24 — ABNORMAL HIGH (ref 9–20)
BUN: 20 mg/dL (ref 6–24)
Bilirubin Total: 0.4 mg/dL (ref 0.0–1.2)
CHLORIDE: 103 mmol/L (ref 96–106)
CO2: 27 mmol/L (ref 20–29)
CREATININE: 0.82 mg/dL (ref 0.76–1.27)
Calcium: 9 mg/dL (ref 8.7–10.2)
GFR calc Af Amer: 118 mL/min/{1.73_m2} (ref >59)
GFR calc non Af Amer: 102 mL/min/{1.73_m2} (ref >59)
GLOBULIN, TOTAL: 2.2 g/dL (ref 1.5–4.5)
GLUCOSE: 93 mg/dL (ref 65–99)
Potassium: 3.9 mmol/L (ref 3.5–5.2)
SODIUM: 143 mmol/L (ref 134–144)
Total Protein: 6.6 g/dL (ref 6.0–8.5)

## 2018-03-28 LAB — LIPID PANEL
CHOLESTEROL TOTAL: 160 mg/dL (ref 100–199)
Chol/HDL Ratio: 3.7 ratio (ref 0.0–5.0)
HDL: 43 mg/dL (ref >39)
LDL CALC: 101 mg/dL — AB (ref 0–99)
TRIGLYCERIDES: 82 mg/dL (ref 0–149)
VLDL Cholesterol Cal: 16 mg/dL (ref 5–40)

## 2018-03-28 LAB — PSA, TOTAL AND FREE
PROSTATE SPECIFIC AG, SERUM: 2.2 ng/mL (ref 0.0–4.0)
PSA FREE: 0.47 ng/mL
PSA, Free Pct: 21.4 %

## 2018-03-28 LAB — TSH+FREE T4
FREE T4: 2.44 ng/dL — AB (ref 0.82–1.77)
TSH: 0.225 u[IU]/mL — AB (ref 0.450–4.500)

## 2018-03-28 LAB — TESTOSTERONE, FREE, TOTAL, SHBG
SEX HORMONE BINDING: 28.1 nmol/L (ref 19.3–76.4)
Testosterone, Free: 5.6 pg/mL — ABNORMAL LOW (ref 7.2–24.0)
Testosterone: 260 ng/dL — ABNORMAL LOW (ref 264–916)

## 2018-04-03 ENCOUNTER — Encounter: Payer: Self-pay | Admitting: Endocrinology

## 2018-04-07 ENCOUNTER — Other Ambulatory Visit: Payer: Self-pay | Admitting: Endocrinology

## 2018-05-06 DIAGNOSIS — E291 Testicular hypofunction: Secondary | ICD-10-CM | POA: Diagnosis not present

## 2018-05-13 DIAGNOSIS — E291 Testicular hypofunction: Secondary | ICD-10-CM | POA: Diagnosis not present

## 2018-05-13 DIAGNOSIS — N401 Enlarged prostate with lower urinary tract symptoms: Secondary | ICD-10-CM | POA: Diagnosis not present

## 2018-07-07 ENCOUNTER — Other Ambulatory Visit (INDEPENDENT_AMBULATORY_CARE_PROVIDER_SITE_OTHER): Payer: 59

## 2018-07-07 DIAGNOSIS — E890 Postprocedural hypothyroidism: Secondary | ICD-10-CM

## 2018-07-07 LAB — TSH: TSH: 4.11 u[IU]/mL (ref 0.35–4.50)

## 2018-07-09 NOTE — Progress Notes (Signed)
Patient ID: Cameron Perez, male   DOB: 10/02/1966, 52 y.o.   MRN: [id number]   Reason for Appointment:  Followup of thyroid   History of Present Illness:   THYROID cancer: He had thyroidectomy for his papillary thyroid carcinoma in 01/2014 Surgical pathology showed the following: PAPILLARY THYROID CARCINOMA, TWO FOCI, 1.7 CM RIGHT LOBE AND 0.3 CM LEFT LOBE, margins not involved. - FOCAL EXTRATHYROID EXTENSION.  Margins: Free of tumor.  Lymph - Vascular invasion: Present. 1 positive lymph node  TREATMENT: He had 76 mCi of I-131 for remnant ablation on 03/29/14 His post therapy body scan showed 3 foci of uptake in the neck with possible uptake in one lymph node  Monitoring:   Follow-up whole-body scan in 01/2018 did not show any abnormal activity  His thyroglobulin level in 10/15 was the highest at 3.6  More recently his thyroglobulin is 3.5 compared to 1.9 in 1/18  Thyroid ultrasound in 12/2016 showed small nodular structures on the right side without any change in comparison and a 0.3 cm nodular structure in the left thyroid bed  HYPOTHYROIDISM  Currently not complaining of any unusual fatigue or weight change He has somewhat variable TSH levels now  He has been on levothyroxine which he has been taking regularly in the morning about half hour before breakfast Not taking any calcium   Since his TSH was relatively low in 3/19 with adding an extra half pill weekly he is now taking 6-1/2 pills a week of the 150 mcg dose However TSH is back up to 4.1     Lab Results  Component Value Date   FREET4 2.44 (H) 03/27/2018   FREET4 1.26 01/07/2018   FREET4 1.19 01/08/2017   TSH 4.11 07/07/2018   TSH 0.225 (L) 03/27/2018   TSH 3.45 01/07/2018    Lab Results  Component Value Date   THYROGLB 3.5 01/07/2018   THYROGLB 1.9 01/08/2017   THYROGLB 2.3 06/29/2016   THYROGLB <2.0 10/31/2015    Wt Readings from Last 3 Encounters:  07/10/18 184 lb 3.2 oz (83.6 kg)    03/27/18 181 lb 3.2 oz (82.2 kg)  01/10/18 182 lb (82.6 kg)     Allergies as of 07/10/2018   Not on File     Medication List        Accurate as of 07/10/18  8:13 AM. Always use your most recent med list.          CIALIS 20 MG tablet Generic drug:  tadalafil   levothyroxine 150 MCG tablet Commonly known as:  SYNTHROID, LEVOTHROID TAKE 1 TABLET BY MOUTH  DAILY BEFORE BREAKFAST       Allergies: Not on File  Past Medical History:  Diagnosis Date  . History of kidney stones 2012  . Kidney stones   . Thyroid cancer ([place]) 2015  . Thyroid disease     Past Surgical History:  Procedure Laterality Date  . LITHOTRIPSY  2011  . THYROIDECTOMY N/A 02/09/2014   Procedure: THYROIDECTOMY;  Surgeon: [name], MD;  Location: [facility];  Service: General;  Laterality: N/A;  . TIBIA FRACTURE SURGERY Left    leg  . VASECTOMY      Family History  Problem Relation Age of Onset  . Breast cancer Mother   . Cancer Mother        Breast  . Hypertension Mother   . Breast cancer Maternal Grandmother   . Cancer Maternal Grandmother   . Diabetes Paternal Grandfather   .  Hyperlipidemia Paternal Grandfather   . Colon cancer Neg Hx     Social History:  reports that he quit smoking about 15 years ago. His smoking use included cigarettes. He quit smokeless tobacco use about 11 years ago. His smokeless tobacco use included chew. He reports that he drinks about 0.6 oz of alcohol per week. He reports that he does not use drugs.   Review of Systems:  No recent issues with hypogonadism, previously had been tried on clomiphene by urologist    EXAM:  BP 122/82 (BP Location: Left Arm, Patient Position: Sitting, Cuff Size: Normal)   Pulse 72   Ht 5\' 9"  (1.753 m)   Wt 184 lb 3.2 oz (83.6 kg)   SpO2 98%   BMI 27.20 kg/m   Neck exam shows no palpable abnormality in the thyroid area No cervical lymphadenopathy    Assessment/Plan:  Hypothyroidism:  As discussed above his TSH  has not been consistent, discussed his goal should be about 1.0 He has been compliant with taking his levothyroxine daily before breakfast His TSH is high normal with current regimen of 150 mcg, 6-1/2 tablets weekly He will now take 1 tablet daily  Thyroid cancer: History of 1.7 cm papillary thyroid carcinoma with extrathyroidal extension and negative margins   His thyroglobulin is persistently high but has been variable ranging from 0.6 up to 3.6 since 09/2014 No consistent trend  Previously his nuclear scan was negative and there was no evidence of any tumor on his ultrasound done in 2018  Will recheck his thyroglobulin on the next visit    [name] 07/10/2018

## 2018-07-10 ENCOUNTER — Encounter: Payer: Self-pay | Admitting: Endocrinology

## 2018-07-10 ENCOUNTER — Ambulatory Visit: Payer: 59 | Admitting: Endocrinology

## 2018-07-10 VITALS — BP 122/82 | HR 72 | Ht 69.0 in | Wt 184.2 lb

## 2018-07-10 DIAGNOSIS — E890 Postprocedural hypothyroidism: Secondary | ICD-10-CM

## 2018-07-10 DIAGNOSIS — C73 Malignant neoplasm of thyroid gland: Secondary | ICD-10-CM

## 2018-07-10 MED ORDER — LEVOTHYROXINE SODIUM 150 MCG PO TABS
ORAL_TABLET | ORAL | 1 refills | Status: DC
Start: 2018-07-10 — End: 2018-11-27

## 2018-07-11 ENCOUNTER — Encounter: Payer: Self-pay | Admitting: Endocrinology

## 2018-09-12 ENCOUNTER — Other Ambulatory Visit (INDEPENDENT_AMBULATORY_CARE_PROVIDER_SITE_OTHER): Payer: 59

## 2018-09-12 DIAGNOSIS — C73 Malignant neoplasm of thyroid gland: Secondary | ICD-10-CM

## 2018-09-12 DIAGNOSIS — E890 Postprocedural hypothyroidism: Secondary | ICD-10-CM | POA: Diagnosis not present

## 2018-09-12 LAB — TSH: TSH: 4.43 u[IU]/mL (ref 0.35–4.50)

## 2018-09-12 LAB — T4, FREE: FREE T4: 1.25 ng/dL (ref 0.60–1.60)

## 2018-09-13 LAB — THYROGLOBULIN BY IMA: Thyroglobulin by IMA: 4.6 ng/mL (ref 1.4–29.2)

## 2018-09-13 LAB — TGAB+THYROGLOBULIN IMA OR LCMS

## 2018-09-23 ENCOUNTER — Other Ambulatory Visit: Payer: Self-pay | Admitting: Endocrinology

## 2018-09-23 DIAGNOSIS — C73 Malignant neoplasm of thyroid gland: Secondary | ICD-10-CM

## 2018-09-25 ENCOUNTER — Encounter: Payer: Self-pay | Admitting: Family Medicine

## 2018-09-25 ENCOUNTER — Ambulatory Visit: Payer: 59 | Admitting: Family Medicine

## 2018-09-25 ENCOUNTER — Other Ambulatory Visit: Payer: Self-pay

## 2018-09-25 VITALS — BP 122/80 | HR 68 | Temp 97.9°F | Ht 69.0 in | Wt 182.4 lb

## 2018-09-25 DIAGNOSIS — E890 Postprocedural hypothyroidism: Secondary | ICD-10-CM

## 2018-09-25 DIAGNOSIS — Z23 Encounter for immunization: Secondary | ICD-10-CM | POA: Diagnosis not present

## 2018-09-25 DIAGNOSIS — I10 Essential (primary) hypertension: Secondary | ICD-10-CM | POA: Diagnosis not present

## 2018-09-25 MED ORDER — AMLODIPINE BESYLATE 5 MG PO TABS: 2.5000 mg | ORAL_TABLET | Freq: Every day | ORAL | 1 refills | Status: DC

## 2018-09-25 NOTE — Patient Instructions (Addendum)
   Okay to stay the same dose of amlodipine for now.  I have sent a refill.  We can recheck the cholesterol at next visit in 6 months for a physical.  Thank you for coming in today.  If you have lab work done today you will be contacted with your lab results within the next 2 weeks.  If you have not heard from [place] then please contact us. The fastest way to get your results is to register for My Chart.   IF you received an x-ray today, you will receive an invoice from [facility]. Please contact [facility] at [phone] with questions or concerns regarding your invoice.   IF you received labwork today, you will receive an invoice from [place]. Please contact [facility] at [phone] with questions or concerns regarding your invoice.   Our billing staff will not be able to assist you with questions regarding bills from these companies.  You will be contacted with the lab results as soon as they are available. The fastest way to get your results is to activate your My Chart account. Instructions are located on the last page of this paperwork. If you have not heard from [place] regarding the results in 2 weeks, please contact this office.

## 2018-09-25 NOTE — Progress Notes (Signed)
Subjective:  By signing my name below, I, Cameron Perez, attest that this documentation has been prepared under the direction and in the presence of Cameron Agreste, MD Electronically Signed: Ladene Artist, ED Scribe 09/25/2018 at 9:26 AM.   Patient ID: Cameron Perez, male    DOB: [birth year], 52 y.o.   MRN: [id number]  Chief Complaint  Patient presents with  . Chronic conditions    6 m f/u (BP)   HPI Cameron Perez is a 52 y.o. male who presents to Primary Care at [facility] for f/u.  Hypothyroidism Followed by Dr. Dwyane Dee. 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 On 7 pills/wk as of last OV. Seen by Dr. Dwyane Dee in July.  Low Testosterone/ED Clomid prescribed by Urology, Cialis prn. - Pt is taking 5 mg Cialis qd. Next OV with urology in early Nov.  HTN Lab Results  Component Value Date   CREATININE 0.82 03/27/2018   BP Readings from Last 3 Encounters:  09/25/18 122/80  07/10/18 122/82  03/27/18 132/80  Had been on amlodipine 5 mg but was off med at last OV, stable readings. - Pt states that he is currently taking 1/2 amlodipine tab qd.  Lipid Screening Lab Results  Component Value Date   CHOL 160 03/27/2018   HDL 43 03/27/2018   LDLCALC 101 (H) 03/27/2018   TRIG 82 03/27/2018   CHOLHDL 3.7 03/27/2018   The 10-year ASCVD risk score Cameron Perez DC Jr., et al., 2013) is: 4.4%   Values used to calculate the score:     Age: 24 years     Sex: Male     Is Non-Hispanic African American: No     Diabetic: No     Tobacco smoker: No     Systolic Blood Pressure: 196 mmHg     Is BP treated: Yes     HDL Cholesterol: 43 mg/dL     Total Cholesterol: 160 mg/dL   Patient Active Problem List   Diagnosis Date Noted  . Special screening for malignant neoplasms, colon 09/21/2014  . 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 Thyroid cancer ([place]) 02/09/2014  . Papillary adenocarcinoma of thyroid ([place]) 01/20/2014   Past Medical History:    Diagnosis Date  . History of kidney stones 2012  . Kidney stones   . Thyroid cancer ([place]) 2015  . Thyroid disease    Past Surgical History:  Procedure Laterality Date  . LITHOTRIPSY  2011  . THYROIDECTOMY N/A 02/09/2014   Procedure: THYROIDECTOMY;  Surgeon: [name], MD;  Location: [facility];  Service: General;  Laterality: N/A;  . TIBIA FRACTURE SURGERY Left    leg  . VASECTOMY     No Known Allergies Prior to Admission medications   Medication Sig Start Date End Date Taking? Authorizing Provider  CIALIS 20 MG tablet  12/17/14  Yes [provider]  levothyroxine (SYNTHROID, LEVOTHROID) 150 MCG tablet TAKE 1 TABLET BY MOUTH&nbsp;&nbsp;DAILY BEFORE BREAKFAST 07/10/18  Yes [name], MD   Social History   Socioeconomic History  . Marital status: Married    Spouse name: Not on file  . Number of children: 5  . Years of education: Not on file  . Highest education level: Not on file  Occupational History  . Occupation: Herbalist: [employer]  Social Needs  . Financial resource strain: Not on file  . Food insecurity:    Worry: Not on file    Inability: Not on file  .  Transportation needs:    Medical: Not on file    Non-medical: Not on file  Tobacco Use  . Smoking status: Former Smoker    Types: Cigarettes    Last attempt to quit: 06/30/2003    Years since quitting: 15.2  . Smokeless tobacco: Former Systems developer    Types: [place] date: 12/31/2006  Substance and Sexual Activity  . Alcohol use: Yes    Alcohol/week: 1.0 standard drinks    Types: 1 Cans of beer per week    Comment: weekly  . Drug use: No    Comment: CHEWS NICOTINE GUM  . Sexual activity: Yes  Lifestyle  . Physical activity:    Days per week: Not on file    Minutes per session: Not on file  . Stress: Not on file  Relationships  . Social connections:    Talks on phone: Not on file    Gets together: Not on file    Attends religious service: Not on file     Active member of club or organization: Not on file    Attends meetings of clubs or organizations: Not on file    Relationship status: Not on file  . Intimate partner violence:    Fear of current or ex partner: Not on file    Emotionally abused: Not on file    Physically abused: Not on file    Forced sexual activity: Not on file  Other Topics Concern  . Not on file  Social History Narrative   Married   Education: College   Exercise: Yes   Review of Systems  Constitutional: Negative for fatigue and unexpected weight change.  Eyes: Negative for visual disturbance.  Respiratory: Negative for cough, chest tightness and shortness of breath.   Cardiovascular: Negative for chest pain, palpitations and leg swelling.  Gastrointestinal: Negative for abdominal pain and blood in stool.  Neurological: Negative for dizziness, light-headedness and headaches.      Objective:   Physical Exam  Constitutional: He is oriented to person, place, and time. He appears well-developed and well-nourished.  HENT:  Head: Normocephalic and atraumatic.  Eyes: Pupils are equal, round, and reactive to light. EOM are normal.  Neck: No JVD present. Carotid bruit is not present.  Cardiovascular: Normal rate, regular rhythm and normal heart sounds.  No murmur heard. Pulmonary/Chest: Effort normal and breath sounds normal. He has no rales.  Musculoskeletal: He exhibits no edema.  Neurological: He is alert and oriented to person, place, and time.  Skin: Skin is warm and dry.  Psychiatric: He has a normal mood and affect.  Vitals reviewed.  Vitals:   09/25/18 0857 09/25/18 0858  BP: 139/85 122/80  Pulse: 68   Temp: 97.9 F (36.6 C)   TempSrc: Oral   SpO2: 93%   Weight: 182 lb 6.4 oz (82.7 kg)   Height: 5\' 9"  (1.753 m)       Assessment & Plan:   Cameron Perez is a 52 y.o. male Essential hypertension  -Stable on low-dose amlodipine.  Continue same dose of 2.5 mg, recheck 6 months for physical with  blood work at that time  Need for influenza vaccination - Plan: Flu Vaccine QUAD 36+ mos IM  Postoperative hypothyroidism  -Followed by endocrinology, plan for ultrasound continuous adjustment of dosing.  Also followed by urology for erectile dysfunction, on daily Cialis.   Meds ordered this encounter  Medications  . amLODipine (NORVASC) 5 MG tablet    Sig: Take 0.5 tablets (  2.5 mg total) by mouth daily.    Dispense:  45 tablet    Refill:  1   Patient Instructions     Okay to stay the same dose of amlodipine for now.  I have sent a refill.  We can recheck the cholesterol at next visit in 6 months for a physical.  Thank you for coming in today.  If you have lab work done today you will be contacted with your lab results within the next 2 weeks.  If you have not heard from [place] then please contact us. The fastest way to get your results is to register for My Chart.   IF you received an x-ray today, you will receive an invoice from [facility]. Please contact [facility] at [phone] with questions or concerns regarding your invoice.   IF you received labwork today, you will receive an invoice from [place]. Please contact [facility] at [phone] with questions or concerns regarding your invoice.   Our billing staff will not be able to assist you with questions regarding bills from these companies.  You will be contacted with the lab results as soon as they are available. The fastest way to get your results is to activate your My Chart account. Instructions are located on the last page of this paperwork. If you have not heard from [place] regarding the results in 2 weeks, please contact this office.       I personally performed the services described in this documentation, which was scribed in my presence. The recorded information has been reviewed and considered for accuracy and completeness, addended by me as needed, and agree with information above.  Signed,    [name], MD Primary [facility] at [facility].  09/25/18 9:38 AM

## 2018-09-26 ENCOUNTER — Ambulatory Visit
Admission: RE | Admit: 2018-09-26 | Discharge: 2018-09-26 | Disposition: A | Discharge status: Home / Self Care | Payer: 59 | Source: Ambulatory Visit | Attending: Endocrinology | Admitting: Endocrinology

## 2018-09-26 DIAGNOSIS — C73 Malignant neoplasm of thyroid gland: Secondary | ICD-10-CM

## 2018-09-26 DIAGNOSIS — E049 Nontoxic goiter, unspecified: Secondary | ICD-10-CM | POA: Diagnosis not present

## 2018-09-27 NOTE — Progress Notes (Signed)
Please call to let patient know that the ultrasound results are normal and no further action needed

## 2018-10-15 ENCOUNTER — Ambulatory Visit (INDEPENDENT_AMBULATORY_CARE_PROVIDER_SITE_OTHER): Payer: 59

## 2018-10-15 ENCOUNTER — Other Ambulatory Visit: Payer: Self-pay

## 2018-10-15 ENCOUNTER — Ambulatory Visit: Payer: 59 | Admitting: Family Medicine

## 2018-10-15 ENCOUNTER — Encounter: Payer: Self-pay | Admitting: Family Medicine

## 2018-10-15 VITALS — BP 126/80 | HR 70 | Temp 97.6°F | Ht 69.0 in | Wt 185.2 lb

## 2018-10-15 DIAGNOSIS — S93401A Sprain of unspecified ligament of right ankle, initial encounter: Secondary | ICD-10-CM | POA: Diagnosis not present

## 2018-10-15 DIAGNOSIS — M25571 Pain in right ankle and joints of right foot: Secondary | ICD-10-CM | POA: Diagnosis not present

## 2018-10-15 NOTE — Patient Instructions (Addendum)
Wear brace for now for probable ankle sprain, but as we discussed I would like to recheck that area in the next 2 weeks to make sure other imaging is not needed.  Okay to weight-bear if that is not causing pain, but if you are having more difficulty with weightbearing, can provide crutches if needed.  Occasional ibuprofen or Aleve as needed, ice and elevation at the end of the day especially as needed.  Return to the clinic or go to the nearest emergency room if any of your symptoms worsen or new symptoms occur.  Ankle Sprain An ankle sprain is a stretch or tear in one of the tough, fiber-like tissues (ligaments) in the ankle. The ligaments in your ankle help to hold the bones of the ankle together. What are the causes? This condition is often caused by stepping on or falling on the outer edge of the foot. What increases the risk? This condition is more likely to develop in people who play sports. What are the signs or symptoms? Symptoms of this condition include:  Pain in your ankle.  Swelling.  Bruising. Bruising may develop right after you sprain your ankle or 1-2 days later.  Trouble standing or walking, especially when you turn or change directions.  How is this diagnosed? This condition is diagnosed with a physical exam. During the exam, your health care provider will press on certain parts of your foot and ankle and try to move them in certain ways. X-rays may be taken to see how severe the sprain is and to check for broken bones. How is this treated? This condition may be treated with:  A brace. This is used to keep the ankle from moving until it heals.  An elastic bandage. This is used to support the ankle.  Crutches.  Pain medicine.  Surgery. This may be needed if the sprain is severe.  Physical therapy. This may help to improve the range of motion in the ankle.  Follow these instructions at home:  Rest your ankle.  Take over-the-counter and prescription  medicines only as told by your health care provider.  For 2-3 days, keep your ankle raised (elevated) above the level of your heart as much as possible.  If directed, apply ice to the area: ? Put ice in a plastic bag. ? Place a towel between your skin and the bag. ? Leave the ice on for 20 minutes, 2-3 times a day.  If you were given a brace: ? Wear it as directed. ? Remove it to shower or bathe. ? Try not to move your ankle much, but wiggle your toes from time to time. This helps to prevent swelling.  If you were given an elastic bandage (dressing): ? Remove it to shower or bathe. ? Try not to move your ankle much, but wiggle your toes from time to time. This helps to prevent swelling. ? Adjust the dressing to make it more comfortable if it feels too tight. ? Loosen the dressing if you have numbness or tingling in your foot, or if your foot becomes cold and blue.  If you have crutches, use them as told by your health care provider. Continue to use them until you can walk without feeling pain in your ankle. Contact a health care provider if:  You have rapidly increasing bruising or swelling.  Your pain is not relieved with medicine. Get help right away if:  Your toes or foot becomes numb or blue.  You have severe pain that  gets worse. This information is not intended to replace advice given to you by your health care provider. Make sure you discuss any questions you have with your health care provider. Document Released: 12/17/2005 Document Revised: 01/24/2017 Document Reviewed: 07/19/2015 Elsevier Interactive Patient Education  [employer].   If you have lab work done today you will be contacted with your lab results within the next 2 weeks.  If you have not heard from [place] then please contact us. The fastest way to get your results is to register for My Chart.   IF you received an x-ray today, you will receive an invoice from [facility]. Please contact  [facility] at [phone] with questions or concerns regarding your invoice.   IF you received labwork today, you will receive an invoice from [place]. Please contact [facility] at [phone] with questions or concerns regarding your invoice.   Our billing staff will not be able to assist you with questions regarding bills from these companies.  You will be contacted with the lab results as soon as they are available. The fastest way to get your results is to activate your My Chart account. Instructions are located on the last page of this paperwork. If you have not heard from [place] regarding the results in 2 weeks, please contact this office.

## 2018-10-15 NOTE — Progress Notes (Addendum)
Subjective:  By signing my name below, I, [name], attest that this documentation has been prepared under the direction and in the presence of [name], MD Electronically Signed: Ladene Artist, ED Scribe 10/15/2018 at 2:13 PM.   Patient ID: Coralee Rud, male    DOB: [birth year], 52 y.o.   MRN: [id number]  Chief Complaint  Patient presents with  . twisted ankle    saturday 4 days ago. right side  . Joint Swelling    from jogging and fell   HPI Christopher A Quattrone is a 52 y.o. male who presents to Primary Care at [facility] complaining of persistent R ankle pain and swelling x 4 days. Pt states that he initially twisted his R ankle a few wks ago without symptoms. States that he inverted and everted the ankle when he fell while running 4 days ago in [place]. He reports swelling and pain since the fall, increased pain with raising his toes. Pt has tried ice, Aleve every 12 hours and kinesio tape. Denies knee pain.  Heard click when injury occurred. Able to WB, trouble standing on toes.   Patient Active Problem List   Diagnosis Date Noted  . Special screening for malignant neoplasms, colon 09/21/2014  . 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 Thyroid cancer ([place]) 02/09/2014  . Papillary adenocarcinoma of thyroid ([place]) 01/20/2014   Past Medical History:  Diagnosis Date  . History of kidney stones 2012  . Kidney stones   . Thyroid cancer ([place]) 2015  . Thyroid disease    Past Surgical History:  Procedure Laterality Date  . LITHOTRIPSY  2011  . THYROIDECTOMY N/A 02/09/2014   Procedure: THYROIDECTOMY;  Surgeon: [name], MD;  Location: [facility];  Service: General;  Laterality: N/A;  . TIBIA FRACTURE SURGERY Left    leg  . VASECTOMY     No Known Allergies Prior to Admission medications   Medication Sig Start Date End Date Taking? Authorizing Provider  amLODipine (NORVASC) 5 MG tablet Take 0.5 tablets (2.5 mg total) by mouth daily. 09/25/18   [name],  MD  CIALIS 20 MG tablet  12/17/14   [provider]  levothyroxine (SYNTHROID, LEVOTHROID) 150 MCG tablet TAKE 1 TABLET BY MOUTH&nbsp;&nbsp;DAILY BEFORE BREAKFAST 07/10/18   [name], MD   Social History   Socioeconomic History  . Marital status: Married    Spouse name: Not on file  . Number of children: 5  . Years of education: Not on file  . Highest education level: Not on file  Occupational History  . Occupation: Herbalist: [employer]  Social Needs  . Financial resource strain: Not on file  . Food insecurity:    Worry: Not on file    Inability: Not on file  . Transportation needs:    Medical: Not on file    Non-medical: Not on file  Tobacco Use  . Smoking status: Former Smoker    Types: Cigarettes    Last attempt to quit: 06/30/2003    Years since quitting: 15.3  . Smokeless tobacco: Former Systems developer    Types: [place] date: 12/31/2006  Substance and Sexual Activity  . Alcohol use: Yes    Alcohol/week: 1.0 standard drinks    Types: 1 Cans of beer per week    Comment: weekly  . Drug use: No    Comment: CHEWS NICOTINE GUM  . Sexual activity: Yes  Lifestyle  . Physical activity:  Days per week: Not on file    Minutes per session: Not on file  . Stress: Not on file  Relationships  . Social connections:    Talks on phone: Not on file    Gets together: Not on file    Attends religious service: Not on file    Active member of club or organization: Not on file    Attends meetings of clubs or organizations: Not on file    Relationship status: Not on file  . Intimate partner violence:    Fear of current or ex partner: Not on file    Emotionally abused: Not on file    Physically abused: Not on file    Forced sexual activity: Not on file  Other Topics Concern  . Not on file  Social History Narrative   Married   Education: College   Exercise: Yes   Review of Systems  Musculoskeletal: Positive for arthralgias and joint  swelling.      Objective:   Physical Exam  Constitutional: He is oriented to person, place, and time. He appears well-developed and well-nourished. No distress.  HENT:  Head: Normocephalic and atraumatic.  Eyes: Conjunctivae and EOM are normal.  Neck: Neck supple. No tracheal deviation present.  Cardiovascular: Normal rate.  Pulmonary/Chest: Effort normal. No respiratory distress.  Musculoskeletal: Normal range of motion.  No proximal fibula tenderness. Tib/fib nontender. R ankle: Medial malleolus. Slight tenderness with associated soft tissue swelling at lateral malleolus. Navicular 5th metatarsal nontender. Foot nontender. Guarded to do toe stance due to pain at posterior ankle and achilles. No focal achilles tenderness, soft tissue swelling or defect. Neg Thompson. Slight decreased inversion and limited eversion. NVI distally. DP 2+.  Neurological: He is alert and oriented to person, place, and time.  Skin: Skin is warm and dry.  Psychiatric: He has a normal mood and affect. His behavior is normal.  Nursing note and vitals reviewed.  Vitals:   10/15/18 1338 10/15/18 1342  BP: (!) 142/83 126/80  Pulse: 70   Temp: 97.6 F (36.4 C)   TempSrc: Oral   SpO2: 98%   Weight: 185 lb 3.2 oz (84 kg)   Height: 5\' 9"  (1.753 m)    Dg Ankle Complete Right  Result Date: 10/15/2018 CLINICAL DATA:  52 year old male with a history of right ankle pain EXAM: RIGHT ANKLE - COMPLETE 3+ VIEW COMPARISON:  None. FINDINGS: No acute displaced fracture identified. Soft tissue swelling at the ankle. Lateral view demonstrates evidence of tibiotalar joint effusion. Enthesopathic changes at the plantar fascia insertion IMPRESSION: Negative for acute bony abnormality. Evidence of soft tissue swelling at the ankle with the lateral view demonstrating tibiotalar joint effusion. Electronically Signed   By: Corrie Mckusick D.O.   On: 10/15/2018 14:45      Assessment & Plan:    ELIZER BOSTIC is a 52 y.o.  male Sprain of right ankle, unspecified ligament, initial encounter - Plan: DG Ankle Complete Right Right ankle pain, unspecified chronicity - Plan: DG Ankle Complete Right  -Reassuring x-ray, suspect lateral sprain, but recheck within 2 weeks to determine if other imaging needed.  If pain with weightbearing, can provide crutches.  Over-the-counter ibuprofen or Aleve as needed, ice and elevation discussed with RTC precautions.   No orders of the defined types were placed in this encounter.  Patient Instructions    Wear brace for now for probable ankle sprain, but as we discussed I would like to recheck that area in the next 2 weeks  to make sure other imaging is not needed.  Okay to weight-bear if that is not causing pain, but if you are having more difficulty with weightbearing, can provide crutches if needed.  Occasional ibuprofen or Aleve as needed, ice and elevation at the end of the day especially as needed.  Return to the clinic or go to the nearest emergency room if any of your symptoms worsen or new symptoms occur.  Ankle Sprain An ankle sprain is a stretch or tear in one of the tough, fiber-like tissues (ligaments) in the ankle. The ligaments in your ankle help to hold the bones of the ankle together. What are the causes? This condition is often caused by stepping on or falling on the outer edge of the foot. What increases the risk? This condition is more likely to develop in people who play sports. What are the signs or symptoms? Symptoms of this condition include:  Pain in your ankle.  Swelling.  Bruising. Bruising may develop right after you sprain your ankle or 1-2 days later.  Trouble standing or walking, especially when you turn or change directions.  How is this diagnosed? This condition is diagnosed with a physical exam. During the exam, your health care provider will press on certain parts of your foot and ankle and try to move them in certain ways. X-rays may be  taken to see how severe the sprain is and to check for broken bones. How is this treated? This condition may be treated with:  A brace. This is used to keep the ankle from moving until it heals.  An elastic bandage. This is used to support the ankle.  Crutches.  Pain medicine.  Surgery. This may be needed if the sprain is severe.  Physical therapy. This may help to improve the range of motion in the ankle.  Follow these instructions at home:  Rest your ankle.  Take over-the-counter and prescription medicines only as told by your health care provider.  For 2-3 days, keep your ankle raised (elevated) above the level of your heart as much as possible.  If directed, apply ice to the area: ? Put ice in a plastic bag. ? Place a towel between your skin and the bag. ? Leave the ice on for 20 minutes, 2-3 times a day.  If you were given a brace: ? Wear it as directed. ? Remove it to shower or bathe. ? Try not to move your ankle much, but wiggle your toes from time to time. This helps to prevent swelling.  If you were given an elastic bandage (dressing): ? Remove it to shower or bathe. ? Try not to move your ankle much, but wiggle your toes from time to time. This helps to prevent swelling. ? Adjust the dressing to make it more comfortable if it feels too tight. ? Loosen the dressing if you have numbness or tingling in your foot, or if your foot becomes cold and blue.  If you have crutches, use them as told by your health care provider. Continue to use them until you can walk without feeling pain in your ankle. Contact a health care provider if:  You have rapidly increasing bruising or swelling.  Your pain is not relieved with medicine. Get help right away if:  Your toes or foot becomes numb or blue.  You have severe pain that gets worse. This information is not intended to replace advice given to you by your health care provider. Make sure you discuss any questions  you have  with your health care provider. Document Released: 12/17/2005 Document Revised: 01/24/2017 Document Reviewed: 07/19/2015 Elsevier Interactive Patient Education  [employer].   If you have lab work done today you will be contacted with your lab results within the next 2 weeks.  If you have not heard from [place] then please contact us. The fastest way to get your results is to register for My Chart.   IF you received an x-ray today, you will receive an invoice from [facility]. Please contact [facility] at [phone] with questions or concerns regarding your invoice.   IF you received labwork today, you will receive an invoice from [place]. Please contact [facility] at [phone] with questions or concerns regarding your invoice.   Our billing staff will not be able to assist you with questions regarding bills from these companies.  You will be contacted with the lab results as soon as they are available. The fastest way to get your results is to activate your My Chart account. Instructions are located on the last page of this paperwork. If you have not heard from [place] regarding the results in 2 weeks, please contact this office.       I personally performed the services described in this documentation, which was scribed in my presence. The recorded information has been reviewed and considered for accuracy and completeness, addended by me as needed, and agree with information above.  Signed,   [name], MD Primary Care at [place].  10/15/18 2:58 PM

## 2018-10-18 ENCOUNTER — Encounter: Payer: Self-pay | Admitting: Family Medicine

## 2018-10-27 ENCOUNTER — Encounter: Payer: Self-pay | Admitting: Family Medicine

## 2018-10-27 ENCOUNTER — Other Ambulatory Visit: Payer: Self-pay

## 2018-10-27 ENCOUNTER — Ambulatory Visit: Payer: 59 | Admitting: Family Medicine

## 2018-10-27 VITALS — BP 160/86 | HR 70 | Temp 98.2°F | Ht 69.0 in | Wt 182.0 lb

## 2018-10-27 DIAGNOSIS — S93491D Sprain of other ligament of right ankle, subsequent encounter: Secondary | ICD-10-CM

## 2018-10-27 NOTE — Patient Instructions (Addendum)
I am glad to hear the ankle has improved.  Brace if needed for high risk activities where the ankle may roll again.  See information below on home exercises.  If you plateau with your symptoms or any worsening symptoms, please follow-up to discuss further imaging.  Thank you for coming in today.  Keep an eye on the blood pressure outside of here, and if that runs over 140/90, let me know.   Ankle Sprain, Phase I Rehab Ask your health care provider which exercises are safe for you. Do exercises exactly as told by your health care provider and adjust them as directed. It is normal to feel mild stretching, pulling, tightness, or discomfort as you do these exercises, but you should stop right away if you feel sudden pain or your pain gets worse.Do not begin these exercises until told by your health care provider. Stretching and range of motion exercises These exercises warm up your muscles and joints and improve the movement and flexibility of your lower leg and ankle. These exercises also help to relieve pain and stiffness. Exercise A: Gastroc and soleus stretch  1. Sit on the floor with your left / right leg extended. 2. Loop a belt or towel around the ball of your left / right foot. The ball of your foot is on the walking surface, right under your toes. 3. Keep your left / right ankle and foot relaxed and keep your knee straight while you use the belt or towel to pull your foot toward you. You should feel a gentle stretch behind your calf or knee. 4. Hold this position for __________ seconds, then release to the starting position. Repeat the exercise with your knee bent. You can put a pillow or a rolled bath towel under your knee to support it. You should feel a stretch deep in your calf or at your Achilles tendon. Repeat each stretch __________ times. Complete these stretches __________ times a day. Exercise B: Ankle alphabet  1. Sit with your left / right leg supported at the lower  leg. ? Do not rest your foot on anything. ? Make sure your foot has room to move freely. 2. Think of your left / right foot as a paintbrush, and move your foot to trace each letter of the alphabet in the air. Keep your hip and knee still while you trace. Make the letters as large as you can without feeling discomfort. 3. Trace every letter from A to Z. Repeat __________ times. Complete this exercise __________ times a day. Strengthening exercises These exercises build strength and endurance in your ankle and lower leg. Endurance is the ability to use your muscles for a long time, even after they get tired. Exercise C: Dorsiflexors  1. Secure a rubber exercise band or tube to an object, such as a table leg, that will stay still when the band is pulled. Secure the other end around your left / right foot. 2. Sit on the floor facing the object, with your left / right leg extended. The band or tube should be slightly tense when your foot is relaxed. 3. Slowly bring your foot toward you, pulling the band tighter. 4. Hold this position for __________ seconds. 5. Slowly return your foot to the starting position. Repeat __________ times. Complete this exercise __________ times a day. Exercise D: Plantar flexors  1. Sit on the floor with your left / right leg extended. 2. Loop a rubber exercise tube or band around the ball of your  left / right foot. The ball of your foot is on the walking surface, right under your toes. ? Hold the ends of the band or tube in your hands. ? The band or tube should be slightly tense when your foot is relaxed. 3. Slowly point your foot and toes downward, pushing them away from you. 4. Hold this position for __________ seconds. 5. Slowly return your foot to the starting position. Repeat __________ times. Complete this exercise __________ times a day. Exercise E: Evertors 1. Sit on the floor with your legs straight out in front of you. 2. Loop a rubber exercise band or  tube around the ball of your left / right foot. The ball of your foot is on the walking surface, right under your toes. ? Hold the ends of the band in your hands, or secure the band to a stable object. ? The band or tube should be slightly tense when your foot is relaxed. 3. Slowly push your foot outward, away from your other leg. 4. Hold this position for __________ seconds. 5. Slowly return your foot to the starting position. Repeat __________ times. Complete this exercise __________ times a day. This information is not intended to replace advice given to you by your health care provider. Make sure you discuss any questions you have with your health care provider. Document Released: 07/18/2005 Document Revised: 08/23/2016 Document Reviewed: 10/31/2015 Elsevier Interactive Patient Education  2018 [employer].   Ankle Sprain, Phase II Rehab Ask your health care provider which exercises are safe for you. Do exercises exactly as told by your health care provider and adjust them as directed. It is normal to feel mild stretching, pulling, tightness, or discomfort as you do these exercises, but you should stop right away if you feel sudden pain or your pain gets worse.Do not begin these exercises until told by your health care provider. Stretching and range of motion exercises These exercises warm up your muscles and joints and improve the movement and flexibility of your lower leg and ankle. These exercises also help to relieve pain and stiffness. Exercise A: Gastroc stretch, standing  1. Stand with your hands against a wall. 2. Extend your left / right leg behind you, and bend your front knee slightly. Your heels should be on the floor. 3. Keeping your heels on the floor and your back knee straight, shift your weight toward the wall. You should feel a gentle stretch in the back of your lower leg (calf). 4. Hold this position for __________ seconds. Repeat __________ times. Complete this  exercise __________ times a day. Exercise B: Soleus stretch, standing 1. Stand with your hands against a wall. 2. Extend your left / right leg behind you, and bend your front knee slightly. Both of your heels should be on the floor. 3. Keeping your heels on the floor, bend your back knee and shift your weight slightly over your back leg. You should feel a gentle stretch deep in your calf. 4. Hold this position for __________ seconds. Repeat __________ times. Complete this exercise __________ times a day. Strengthening exercises These exercises build strength and endurance in your lower leg. Endurance is the ability to use your muscles for a long time, even after they get tired. Exercise C: Heel walking ( dorsiflexion) Walk on your heels for __________ seconds or ___________ ft. Keep your toes as high as possible. Repeat __________ times. Complete this exercise __________ times a day. Balance exercises These exercises improve your balance and the reaction  and control of your ankle to help improve stability. Exercise D: Multi-angle lunge 1. Stand with your feet together. 2. Take a step forward with your left / right leg, and shift your weight onto that leg. Your back heel will come off the floor, and your back toes will stay in place. 3. Push off your front leg to return your front foot to the starting position next to your other foot. 4. Repeat to the side, to the back, and any other directions as told by your health care provider. Repeat in each direction __________ times. Complete this exercise __________ times a day. Exercise E: Single leg stand 1. Without shoes, stand near a railing or in a door frame. Hold onto the railing or door frame as needed. 2. Stand on your left / right foot. Keep your big toe down on the floor and try to keep your arch lifted. 3. Hold this position for __________ seconds. Repeat __________ times. Complete this exercise __________ times a day. If this exercise is  too easy, you can try it with your eyes closed or while standing on a pillow. Exercise F: Inversion/eversion  You will need a balance board for this exercise. Ask your health care provider where you can get a balance board or how you can make one. 1. Stand on a non-carpeted surface near a countertop or wall. 2. Step onto the balance board so your feet are hip-width apart. 3. Keep your feet in place and keep your upper body and hips steady. Using only your feet and ankles to move the board, do one or both of the following exercises as told by your health care provider: ? Tip the board side to side as far as you can, alternating between tipping to the left and tipping to the right. If you can, tip the board so it silently taps the floor. Do not let the board forcefully hit the floor. From time to time, pause to hold a steady position. ? Tip the board side to side so the board does not hit the floor at all. From time to time, pause to hold a steady position. Repeat the movement for each exercise __________ times. Complete each exercise __________ times a day. Exercise G: Plantar flexion/dorsiflexion  You will need a balance board for this exercise. Ask your health care provider where you can get a balance board or how you can make one. 1. Stand on a non-carpeted surface near a countertop or wall. 2. Step onto the balance board so your feet are hip-width apart. 3. Keep your feet in place and keep your upper body and hips steady. Using only your feet and ankles to move the board, do one or both of the following exercises as told by your health care provider: ? Tip the board forward and backward so the board silently taps the floor. Do not let the board forcefully hit the floor. From time to time, pause to hold a steady position. ? Tip the board forward and backward so the board does not hit the floor at all. From time to time, pause to hold a steady position. Repeat the movement for each exercise  __________ times. Complete each exercise __________ times a day. This information is not intended to replace advice given to you by your health care provider. Make sure you discuss any questions you have with your health care provider. Document Released: 04/08/2006 Document Revised: 08/23/2016 Document Reviewed: 10/31/2015 Elsevier Interactive Patient Education  [employer].   If  you have lab work done today you will be contacted with your lab results within the next 2 weeks.  If you have not heard from [place] then please contact us. The fastest way to get your results is to register for My Chart.   IF you received an x-ray today, you will receive an invoice from [facility]. Please contact [facility] at [phone] with questions or concerns regarding your invoice.   IF you received labwork today, you will receive an invoice from [place]. Please contact [facility] at [phone] with questions or concerns regarding your invoice.   Our billing staff will not be able to assist you with questions regarding bills from these companies.  You will be contacted with the lab results as soon as they are available. The fastest way to get your results is to activate your My Chart account. Instructions are located on the last page of this paperwork. If you have not heard from [place] regarding the results in 2 weeks, please contact this office.

## 2018-10-27 NOTE — Progress Notes (Signed)
Subjective:  By signing my name below, I, [name], attest that this documentation has been prepared under the direction and in the presence of [name], MD Electronically Signed: Ladene Artist, ED Scribe 10/27/2018 at 4:21 PM.   Patient ID: Cameron Perez, male    DOB: [birth year], 52 y.o.   MRN: [id number]  Chief Complaint  Patient presents with  . rt ankle check    2 wk f/u    HPI Cameron Perez is a 52 y.o. male who presents to Primary Care at [facility] for f/u of R ankle pain. Seen after injury 10/16. Inverted/everted ankle 4 days prior in [place]. Treated with kinesio tape, ice but some discomfort including with dorsiflexion. Some soft tissue swelling with tibiotalar joint effusion on XR but without acute bony abnormality. Suspected lateral sprain but wit close f/u given effusion. Placed in lace up brace, option of crutches if pain with WB. OTC NSAID, ice and elevation.   Pt states that he feels that his R ankle is ~80% improved. Still wears brace at home and compression sock when he goes out. No meds tried recently. He has started going on walks but has not returned to training. Denies pain with bearing weight.  Pt states that he has just had a rough day today which is why his BP is elevated. Denies h/o elevated BP.  Patient Active Problem List   Diagnosis Date Noted  . Special screening for malignant neoplasms, colon 09/21/2014  . 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 Thyroid cancer ([place]) 02/09/2014  . 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 History of kidney stones 2012  . Kidney stones   . Thyroid cancer ([place]) 2015  . Thyroid disease    Past Surgical History:  Procedure Laterality Date  . LITHOTRIPSY  2011  . THYROIDECTOMY N/A 02/09/2014   Procedure: THYROIDECTOMY;  Surgeon: [name], MD;  Location: [facility];  Service: General;  Laterality: N/A;  . TIBIA FRACTURE SURGERY Left    leg  .  VASECTOMY     No Known Allergies Prior to Admission medications   Medication Sig Start Date End Date Taking? Authorizing Provider  CIALIS 20 MG tablet  12/17/14   [provider]  levothyroxine (SYNTHROID, LEVOTHROID) 150 MCG tablet TAKE 1 TABLET BY MOUTH&nbsp;&nbsp;DAILY BEFORE BREAKFAST 07/10/18   [name], MD   Social History   Socioeconomic History  . Marital status: Married    Spouse name: Not on file  . Number of children: 5  . Years of education: Not on file  . Highest education level: Not on file  Occupational History  . Occupation: Herbalist: [employer]  Social Needs  . Financial resource strain: Not on file  . Food insecurity:    Worry: Not on file    Inability: Not on file  . Transportation needs:    Medical: Not on file    Non-medical: Not on file  Tobacco Use  . Smoking status: Former Smoker    Types: Cigarettes    Last attempt to quit: 06/30/2003    Years since quitting: 15.3  . Smokeless tobacco: Former Systems developer    Types: [place] date: 12/31/2006  Substance and Sexual Activity  . Alcohol use: Yes    Alcohol/week: 1.0 standard drinks    Types: 1 Cans of beer per week    Comment: weekly  . Drug use: No  Comment: CHEWS NICOTINE GUM  . Sexual activity: Yes  Lifestyle  . Physical activity:    Days per week: Not on file    Minutes per session: Not on file  . Stress: Not on file  Relationships  . Social connections:    Talks on phone: Not on file    Gets together: Not on file    Attends religious service: Not on file    Active member of club or organization: Not on file    Attends meetings of clubs or organizations: Not on file    Relationship status: Not on file  . Intimate partner violence:    Fear of current or ex partner: Not on file    Emotionally abused: Not on file    Physically abused: Not on file    Forced sexual activity: Not on file  Other Topics Concern  . Not on file  Social History Narrative    Married   Education: College   Exercise: Yes   Review of Systems  Musculoskeletal: Positive for arthralgias (improved). Negative for joint swelling (improved).      Objective:   Physical Exam  Constitutional: He is oriented to person, place, and time. He appears well-developed and well-nourished. No distress.  HENT:  Head: Normocephalic and atraumatic.  Eyes: Conjunctivae and EOM are normal.  Neck: Neck supple. No tracheal deviation present.  Cardiovascular: Normal rate.  Pulmonary/Chest: Effort normal. No respiratory distress.  Musculoskeletal:  R ankle: Very minimal decreased plantar flexion. Dorsiflexion grossly equal. Very minimal eversion compared to L. Able to evert/invert, dorsi and plantar flex against resistance. Slight discomfort at origin of ATF. Anterior and posterior ankle including achilles is nontender. No erythema. Poss minimal effusion.  Neurological: He is alert and oriented to person, place, and time.  Skin: Skin is warm and dry.  Psychiatric: He has a normal mood and affect. His behavior is normal.  Nursing note and vitals reviewed.  Vitals:   10/27/18 1556 10/27/18 1558  BP: (!) 148/92 (!) 160/86  Pulse: 70   Temp: 98.2 F (36.8 C)   TempSrc: Oral   SpO2: 96%   Weight: 182 lb (82.6 kg)   Height: 5\' 9"  (1.753 m)       Assessment & Plan:   [name] is a 52 y.o. male Sprain of anterior talofibular ligament of right ankle, subsequent encounter  -Appears to be consistent with typical lateral ankle sprain at this time.  Discussed previous imaging results including effusion.  -  Clinically much improved, will hold on further imaging including CT/MRI at this time.  Home exercise program discussed with handout and Thera-Band given with RTC precautions.  No orders of the defined types were placed in this encounter.  Patient Instructions     I am glad to hear the ankle has improved.  Brace if needed for high risk activities where the ankle may roll  again.  See information below on home exercises.  If you plateau with your symptoms or any worsening symptoms, please follow-up to discuss further imaging.  Thank you for coming in today.  Keep an eye on the blood pressure outside of here, and if that runs over 140/90, let me know.   Ankle Sprain, Phase I Rehab Ask your health care provider which exercises are safe for you. Do exercises exactly as told by your health care provider and adjust them as directed. It is normal to feel mild stretching, pulling, tightness, or discomfort as you do these exercises, but you  should stop right away if you feel sudden pain or your pain gets worse.Do not begin these exercises until told by your health care provider. Stretching and range of motion exercises These exercises warm up your muscles and joints and improve the movement and flexibility of your lower leg and ankle. These exercises also help to relieve pain and stiffness. Exercise A: Gastroc and soleus stretch  1. Sit on the floor with your left / right leg extended. 2. Loop a belt or towel around the ball of your left / right foot. The ball of your foot is on the walking surface, right under your toes. 3. Keep your left / right ankle and foot relaxed and keep your knee straight while you use the belt or towel to pull your foot toward you. You should feel a gentle stretch behind your calf or knee. 4. Hold this position for __________ seconds, then release to the starting position. Repeat the exercise with your knee bent. You can put a pillow or a rolled bath towel under your knee to support it. You should feel a stretch deep in your calf or at your Achilles tendon. Repeat each stretch __________ times. Complete these stretches __________ times a day. Exercise B: Ankle alphabet  1. Sit with your left / right leg supported at the lower leg. ? Do not rest your foot on anything. ? Make sure your foot has room to move freely. 2. Think of your left / right  foot as a paintbrush, and move your foot to trace each letter of the alphabet in the air. Keep your hip and knee still while you trace. Make the letters as large as you can without feeling discomfort. 3. Trace every letter from A to Z. Repeat __________ times. Complete this exercise __________ times a day. Strengthening exercises These exercises build strength and endurance in your ankle and lower leg. Endurance is the ability to use your muscles for a long time, even after they get tired. Exercise C: Dorsiflexors  1. Secure a rubber exercise band or tube to an object, such as a table leg, that will stay still when the band is pulled. Secure the other end around your left / right foot. 2. Sit on the floor facing the object, with your left / right leg extended. The band or tube should be slightly tense when your foot is relaxed. 3. Slowly bring your foot toward you, pulling the band tighter. 4. Hold this position for __________ seconds. 5. Slowly return your foot to the starting position. Repeat __________ times. Complete this exercise __________ times a day. Exercise D: Plantar flexors  1. Sit on the floor with your left / right leg extended. 2. Loop a rubber exercise tube or band around the ball of your left / right foot. The ball of your foot is on the walking surface, right under your toes. ? Hold the ends of the band or tube in your hands. ? The band or tube should be slightly tense when your foot is relaxed. 3. Slowly point your foot and toes downward, pushing them away from you. 4. Hold this position for __________ seconds. 5. Slowly return your foot to the starting position. Repeat __________ times. Complete this exercise __________ times a day. Exercise E: Evertors 1. Sit on the floor with your legs straight out in front of you. 2. Loop a rubber exercise band or tube around the ball of your left / right foot. The ball of your foot is on the walking  surface, right under your  toes. ? Hold the ends of the band in your hands, or secure the band to a stable object. ? The band or tube should be slightly tense when your foot is relaxed. 3. Slowly push your foot outward, away from your other leg. 4. Hold this position for __________ seconds. 5. Slowly return your foot to the starting position. Repeat __________ times. Complete this exercise __________ times a day. This information is not intended to replace advice given to you by your health care provider. Make sure you discuss any questions you have with your health care provider. Document Released: 07/18/2005 Document Revised: 08/23/2016 Document Reviewed: 10/31/2015 Elsevier Interactive Patient Education  2018 [employer].   Ankle Sprain, Phase II Rehab Ask your health care provider which exercises are safe for you. Do exercises exactly as told by your health care provider and adjust them as directed. It is normal to feel mild stretching, pulling, tightness, or discomfort as you do these exercises, but you should stop right away if you feel sudden pain or your pain gets worse.Do not begin these exercises until told by your health care provider. Stretching and range of motion exercises These exercises warm up your muscles and joints and improve the movement and flexibility of your lower leg and ankle. These exercises also help to relieve pain and stiffness. Exercise A: Gastroc stretch, standing  1. Stand with your hands against a wall. 2. Extend your left / right leg behind you, and bend your front knee slightly. Your heels should be on the floor. 3. Keeping your heels on the floor and your back knee straight, shift your weight toward the wall. You should feel a gentle stretch in the back of your lower leg (calf). 4. Hold this position for __________ seconds. Repeat __________ times. Complete this exercise __________ times a day. Exercise B: Soleus stretch, standing 1. Stand with your hands against a  wall. 2. Extend your left / right leg behind you, and bend your front knee slightly. Both of your heels should be on the floor. 3. Keeping your heels on the floor, bend your back knee and shift your weight slightly over your back leg. You should feel a gentle stretch deep in your calf. 4. Hold this position for __________ seconds. Repeat __________ times. Complete this exercise __________ times a day. Strengthening exercises These exercises build strength and endurance in your lower leg. Endurance is the ability to use your muscles for a long time, even after they get tired. Exercise C: Heel walking ( dorsiflexion) Walk on your heels for __________ seconds or ___________ ft. Keep your toes as high as possible. Repeat __________ times. Complete this exercise __________ times a day. Balance exercises These exercises improve your balance and the reaction and control of your ankle to help improve stability. Exercise D: Multi-angle lunge 1. Stand with your feet together. 2. Take a step forward with your left / right leg, and shift your weight onto that leg. Your back heel will come off the floor, and your back toes will stay in place. 3. Push off your front leg to return your front foot to the starting position next to your other foot. 4. Repeat to the side, to the back, and any other directions as told by your health care provider. Repeat in each direction __________ times. Complete this exercise __________ times a day. Exercise E: Single leg stand 1. Without shoes, stand near a railing or in a door frame. Hold onto the railing  or door frame as needed. 2. Stand on your left / right foot. Keep your big toe down on the floor and try to keep your arch lifted. 3. Hold this position for __________ seconds. Repeat __________ times. Complete this exercise __________ times a day. If this exercise is too easy, you can try it with your eyes closed or while standing on a pillow. Exercise F:  Inversion/eversion  You will need a balance board for this exercise. Ask your health care provider where you can get a balance board or how you can make one. 1. Stand on a non-carpeted surface near a countertop or wall. 2. Step onto the balance board so your feet are hip-width apart. 3. Keep your feet in place and keep your upper body and hips steady. Using only your feet and ankles to move the board, do one or both of the following exercises as told by your health care provider: ? Tip the board side to side as far as you can, alternating between tipping to the left and tipping to the right. If you can, tip the board so it silently taps the floor. Do not let the board forcefully hit the floor. From time to time, pause to hold a steady position. ? Tip the board side to side so the board does not hit the floor at all. From time to time, pause to hold a steady position. Repeat the movement for each exercise __________ times. Complete each exercise __________ times a day. Exercise G: Plantar flexion/dorsiflexion  You will need a balance board for this exercise. Ask your health care provider where you can get a balance board or how you can make one. 1. Stand on a non-carpeted surface near a countertop or wall. 2. Step onto the balance board so your feet are hip-width apart. 3. Keep your feet in place and keep your upper body and hips steady. Using only your feet and ankles to move the board, do one or both of the following exercises as told by your health care provider: ? Tip the board forward and backward so the board silently taps the floor. Do not let the board forcefully hit the floor. From time to time, pause to hold a steady position. ? Tip the board forward and backward so the board does not hit the floor at all. From time to time, pause to hold a steady position. Repeat the movement for each exercise __________ times. Complete each exercise __________ times a day. This information is not  intended to replace advice given to you by your health care provider. Make sure you discuss any questions you have with your health care provider. Document Released: 04/08/2006 Document Revised: 08/23/2016 Document Reviewed: 10/31/2015 Elsevier Interactive Patient Education  [employer].   If you have lab work done today you will be contacted with your lab results within the next 2 weeks.  If you have not heard from [place] then please contact us. The fastest way to get your results is to register for My Chart.   IF you received an x-ray today, you will receive an invoice from [facility]. Please contact [facility] at [phone] with questions or concerns regarding your invoice.   IF you received labwork today, you will receive an invoice from [facility]. Please contact [facility] at [phone] with questions or concerns regarding your invoice.   Our billing staff will not be able to assist you with questions regarding bills from these companies.  You will be contacted with the lab  results as soon as they are available. The fastest way to get your results is to activate your My Chart account. Instructions are located on the last page of this paperwork. If you have not heard from [place] regarding the results in 2 weeks, please contact this office.       I personally performed the services described in this documentation, which was scribed in my presence. The recorded information has been reviewed and considered for accuracy and completeness, addended by me as needed, and agree with information above.  Signed,   [name], MD [facility] at [facility].  10/29/18 10:12 PM

## 2018-11-04 DIAGNOSIS — R350 Frequency of micturition: Secondary | ICD-10-CM | POA: Diagnosis not present

## 2018-11-04 DIAGNOSIS — E291 Testicular hypofunction: Secondary | ICD-10-CM | POA: Diagnosis not present

## 2018-11-04 DIAGNOSIS — N401 Enlarged prostate with lower urinary tract symptoms: Secondary | ICD-10-CM | POA: Diagnosis not present

## 2018-11-11 DIAGNOSIS — E291 Testicular hypofunction: Secondary | ICD-10-CM | POA: Diagnosis not present

## 2018-11-11 DIAGNOSIS — N401 Enlarged prostate with lower urinary tract symptoms: Secondary | ICD-10-CM | POA: Diagnosis not present

## 2018-11-27 ENCOUNTER — Other Ambulatory Visit: Payer: Self-pay | Admitting: Endocrinology

## 2018-12-26 DIAGNOSIS — E039 Hypothyroidism, unspecified: Secondary | ICD-10-CM | POA: Diagnosis not present

## 2018-12-26 DIAGNOSIS — I10 Essential (primary) hypertension: Secondary | ICD-10-CM | POA: Diagnosis not present

## 2018-12-26 DIAGNOSIS — S0501XA Injury of conjunctiva and corneal abrasion without foreign body, right eye, initial encounter: Secondary | ICD-10-CM | POA: Diagnosis not present

## 2019-01-07 ENCOUNTER — Other Ambulatory Visit: Payer: Self-pay | Admitting: Endocrinology

## 2019-01-07 DIAGNOSIS — E890 Postprocedural hypothyroidism: Secondary | ICD-10-CM

## 2019-01-09 ENCOUNTER — Other Ambulatory Visit (INDEPENDENT_AMBULATORY_CARE_PROVIDER_SITE_OTHER): Payer: 59

## 2019-01-09 DIAGNOSIS — E890 Postprocedural hypothyroidism: Secondary | ICD-10-CM

## 2019-01-09 LAB — TSH: TSH: 1.77 u[IU]/mL (ref 0.35–4.50)

## 2019-01-09 LAB — T4, FREE: Free T4: 1.36 ng/dL (ref 0.60–1.60)

## 2019-01-16 ENCOUNTER — Ambulatory Visit: Payer: 59 | Admitting: Endocrinology

## 2019-01-16 ENCOUNTER — Encounter: Payer: Self-pay | Admitting: Endocrinology

## 2019-01-16 VITALS — BP 110/82 | HR 69 | Ht 69.0 in | Wt 178.0 lb

## 2019-01-16 DIAGNOSIS — C73 Malignant neoplasm of thyroid gland: Secondary | ICD-10-CM

## 2019-01-16 DIAGNOSIS — E890 Postprocedural hypothyroidism: Secondary | ICD-10-CM | POA: Diagnosis not present

## 2019-01-16 NOTE — Progress Notes (Signed)
Patient ID: Cameron Perez, male   DOB: 1966-01-09, 53 y.o.   MRN: [id number]   Reason for Appointment:  Followup of thyroid   History of Present Illness:   THYROID cancer: He had thyroidectomy for his papillary thyroid carcinoma in 01/2014 Surgical pathology showed the following: PAPILLARY THYROID CARCINOMA, TWO FOCI, 1.7 CM RIGHT LOBE AND 0.3 CM LEFT LOBE, margins not involved. - FOCAL EXTRATHYROID EXTENSION.  Margins: Free of tumor.  Lymph - Vascular invasion: Present. 1 positive lymph node  TREATMENT: He had 76 mCi of I-131 for remnant ablation on 03/29/14 His post therapy body scan showed 3 foci of uptake in the neck with possible uptake in one lymph node  Monitoring:   Follow-up whole-body scan in 01/2018 did not show any abnormal activity Thyroid ultrasound in 08/2018 does not show any signs of tumor recurrence or lymphadenopathy His thyroglobulin level in 9/19 was relatively high at 4.6  HYPOTHYROIDISM  TSH levels had been upper normal in the last 2 times He now is taking 150 mcg, 7-1/2 tablets/week  He thinks that with this his energy level is somewhat better He has been trying to lose weight with exercise  He has been on levothyroxine  regularly in the morning about half hour before breakfast Not taking any calcium   TSH is now 1.7     Lab Results  Component Value Date   FREET4 1.36 01/09/2019   FREET4 1.25 09/12/2018   FREET4 2.44 (H) 03/27/2018   TSH 1.77 01/09/2019   TSH 4.43 09/12/2018   TSH 4.11 07/07/2018    Lab Results  Component Value Date   THYROGLB 4.6 09/12/2018   THYROGLB 3.5 01/07/2018   THYROGLB 1.9 01/08/2017   THYROGLB 2.3 06/29/2016    Wt Readings from Last 3 Encounters:  01/16/19 178 lb (80.7 kg)  10/27/18 182 lb (82.6 kg)  10/15/18 185 lb 3.2 oz (84 kg)     Allergies as of 01/16/2019   No Known Allergies     Medication List       Accurate as of January 16, 2019  8:20 AM. Always use your most recent med list.         amLODipine 5 MG tablet Commonly known as:  NORVASC Take 5 mg by mouth daily. TAKE 1 TABLET BY MOUTH ONCE DAILY.   CIALIS 20 MG tablet Generic drug:  tadalafil   clomiPHENE 50 MG tablet Commonly known as:  CLOMID Take 50 mg by mouth daily. TAKE 1 TABLET BY MOUTH ONCE DAILY.   levothyroxine 150 MCG tablet Commonly known as:  SYNTHROID, LEVOTHROID TAKE 1 TABLET BY MOUTH  DAILY BEFORE BREAKFAST       Allergies: No Known Allergies  Past Medical History:  Diagnosis Date  . History of kidney stones 2012  . Kidney stones   . Thyroid cancer ([place]) 2015  . Thyroid disease     Past Surgical History:  Procedure Laterality Date  . LITHOTRIPSY  2011  . THYROIDECTOMY N/A 02/09/2014   Procedure: THYROIDECTOMY;  Surgeon: [name], MD;  Location: [facility];  Service: General;  Laterality: N/A;  . TIBIA FRACTURE SURGERY Left    leg  . VASECTOMY      Family History  Problem Relation Age of Onset  . Breast cancer Mother   . Cancer Mother        Breast  . Hypertension Mother   . Breast cancer Maternal Grandmother   . Cancer Maternal Grandmother   . Diabetes  Paternal Grandfather   . Hyperlipidemia Paternal Grandfather   . Colon cancer Neg Hx     Social History:  reports that he quit smoking about 15 years ago. His smoking use included cigarettes. He quit smokeless tobacco use about 12 years ago.  His smokeless tobacco use included chew. He reports current alcohol use of about 1.0 standard drinks of alcohol per week. He reports that he does not use drugs.   Review of Systems:  No new problems Followed by urologist for hypogonadism with clomiphene   EXAM:  BP 110/82 (BP Location: Left Arm, Patient Position: Sitting, Cuff Size: Normal)   Pulse 69   Ht 5\' 9"  (1.753 m)   Wt 178 lb (80.7 kg)   SpO2 94%   BMI 26.29 kg/m   Neck exam normal, no mass in the thyroid area No cervical lymphadenopathy    Assessment/Plan:  Hypothyroidism:  TSH is now 1.7 is  taking 150 mcg, 7-1/2 tablets a week He has been taking this regularly He feels subjectively fairly good now we will continue the same regimen  Thyroid cancer: History of 1.7 cm papillary thyroid carcinoma with extrathyroidal extension and negative margins   His thyroglobulin is persistently high but has been variable Even with the recent increase last September his imaging studies including ultrasound are negative   Will recheck his thyroglobulin on the next visit    [name] 01/16/2019

## 2019-02-05 DIAGNOSIS — E291 Testicular hypofunction: Secondary | ICD-10-CM | POA: Diagnosis not present

## 2019-02-08 ENCOUNTER — Encounter: Payer: Self-pay | Admitting: Family Medicine

## 2019-02-09 MED ORDER — AMLODIPINE BESYLATE 5 MG PO TABS: 5.0000 mg | ORAL_TABLET | Freq: Every day | ORAL | 0 refills | Status: DC

## 2019-02-10 ENCOUNTER — Other Ambulatory Visit: Payer: Self-pay | Admitting: *Deleted

## 2019-02-10 MED ORDER — AMLODIPINE BESYLATE 5 MG PO TABS: 5.0000 mg | ORAL_TABLET | Freq: Every day | ORAL | 0 refills | Status: DC

## 2019-02-12 DIAGNOSIS — R350 Frequency of micturition: Secondary | ICD-10-CM | POA: Diagnosis not present

## 2019-02-12 DIAGNOSIS — E291 Testicular hypofunction: Secondary | ICD-10-CM | POA: Diagnosis not present

## 2019-02-12 DIAGNOSIS — N401 Enlarged prostate with lower urinary tract symptoms: Secondary | ICD-10-CM | POA: Diagnosis not present

## 2019-03-10 ENCOUNTER — Ambulatory Visit: Payer: 59 | Admitting: Family Medicine

## 2019-03-10 ENCOUNTER — Ambulatory Visit (INDEPENDENT_AMBULATORY_CARE_PROVIDER_SITE_OTHER): Payer: 59

## 2019-03-10 ENCOUNTER — Encounter: Payer: Self-pay | Admitting: Family Medicine

## 2019-03-10 ENCOUNTER — Other Ambulatory Visit: Payer: Self-pay

## 2019-03-10 VITALS — BP 119/76 | HR 65 | Temp 97.8°F | Resp 18 | Ht 69.0 in | Wt 174.0 lb

## 2019-03-10 DIAGNOSIS — S8002XA Contusion of left knee, initial encounter: Secondary | ICD-10-CM | POA: Diagnosis not present

## 2019-03-10 DIAGNOSIS — M25562 Pain in left knee: Secondary | ICD-10-CM

## 2019-03-10 DIAGNOSIS — M7989 Other specified soft tissue disorders: Secondary | ICD-10-CM | POA: Diagnosis not present

## 2019-03-10 DIAGNOSIS — M25462 Effusion, left knee: Secondary | ICD-10-CM | POA: Diagnosis not present

## 2019-03-10 NOTE — Progress Notes (Signed)
Subjective:    Patient ID: Cameron Perez, male    DOB: [birth year], 53 y.o.   MRN: [id number]  HPI Cameron Perez is a 53 y.o. male Presents today for: Chief Complaint  Patient presents with  . Knee Pain    left knee pain fell about a month ago feels like fluid inside not painful x2weeks    L knee pain: Fell onto front of knee 1 month ago at home - hit hardwood floor. Some soreness in front, some "rice krispies" intiially, but improved. Normal function. Still able to run, 3 miles yesterday.  no pain, no instability. Feels like a balloon on front of knee. Soreness first few days only. Swelling in front for past few weeks. Tx: ice, ibuprofen, no changes.   Patient Active Problem List   Diagnosis Date Noted  . Special screening for malignant neoplasms, colon 09/21/2014  . 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 Thyroid cancer ([place]) 02/09/2014  . 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 History of kidney stones 2012  . Kidney stones   . Thyroid cancer ([place]) 2015  . Thyroid disease    Past Surgical History:  Procedure Laterality Date  . LITHOTRIPSY  2011  . THYROIDECTOMY N/A 02/09/2014   Procedure: THYROIDECTOMY;  Surgeon: [name], MD;  Location: [facility];  Service: General;  Laterality: N/A;  . TIBIA FRACTURE SURGERY Left    leg  . VASECTOMY     No Known Allergies Prior to Admission medications   Medication Sig Start Date End Date Taking? Authorizing Provider  amLODipine (NORVASC) 5 MG tablet Take 1 tablet (5 mg total) by mouth daily. TAKE 1 TABLET BY MOUTH ONCE DAILY. 02/10/19  Yes [name], MD  CIALIS 20 MG tablet  12/17/14  Yes [provider]  clomiPHENE (CLOMID) 50 MG tablet Take 50 mg by mouth daily. TAKE 1 TABLET BY MOUTH ONCE DAILY.   Yes [provider]  levothyroxine (SYNTHROID, LEVOTHROID) 150 MCG tablet TAKE 1 TABLET BY MOUTH  DAILY BEFORE BREAKFAST 11/28/18  Yes  [name], MD   Social History   Socioeconomic History  . Marital status: Married    Spouse name: Not on file  . Number of children: 5  . Years of education: Not on file  . Highest education level: Not on file  Occupational History  . Occupation: Herbalist: [employer]  Social Needs  . Financial resource strain: Not on file  . Food insecurity:    Worry: Not on file    Inability: Not on file  . Transportation needs:    Medical: Not on file    Non-medical: Not on file  Tobacco Use  . Smoking status: Former Smoker    Types: Cigarettes    Last attempt to quit: 06/30/2003    Years since quitting: 15.7  . Smokeless tobacco: Former Systems developer    Types: [place] date: 12/31/2006  Substance and Sexual Activity  . Alcohol use: Yes    Alcohol/week: 1.0 standard drinks    Types: 1 Cans of beer per week    Comment: weekly  . Drug use: No    Comment: CHEWS NICOTINE GUM  . Sexual activity: Yes  Lifestyle  . Physical activity:    Days per week: Not on file    Minutes per session: Not on file  . Stress: Not on file  Relationships  .  Social connections:    Talks on phone: Not on file    Gets together: Not on file    Attends religious service: Not on file    Active member of club or organization: Not on file    Attends meetings of clubs or organizations: Not on file    Relationship status: Not on file  . Intimate partner violence:    Fear of current or ex partner: Not on file    Emotionally abused: Not on file    Physically abused: Not on file    Forced sexual activity: Not on file  Other Topics Concern  . Not on file  Social History Narrative   Married   Education: College   Exercise: Yes    Review of Systems     Objective:   Physical Exam Constitutional:      General: He is not in acute distress.    Appearance: He is well-developed.  HENT:     Head: Normocephalic and atraumatic.  Cardiovascular:     Rate and Rhythm: Normal rate.    Pulmonary:     Effort: Pulmonary effort is normal.  Musculoskeletal:     Left knee: He exhibits swelling (prepatellar - medial greater than lateral. does not appear to have joint effusion. ) and deformity (step off mid patellar with crepitance. ). He exhibits normal range of motion, no effusion, no ecchymosis, no erythema (skin intact, no warmth, erythema or rash. ), normal alignment, no LCL laxity, normal patellar mobility, no bony tenderness and no MCL laxity. No tenderness found. No medial joint line and no lateral joint line tenderness noted.  Neurological:     Mental Status: He is alert and oriented to person, place, and time.    Vitals:   03/10/19 1121  BP: 119/76  Pulse: 65  Resp: 18  Temp: 97.8 F (36.6 C)  TempSrc: Oral  SpO2: 98%  Weight: 174 lb (78.9 kg)  Height: 5\' 9"  (1.753 m)   [name] Complete 4 Views Left  Result Date: 03/10/2019 CLINICAL DATA:  Left knee prepatellar swelling. EXAM: LEFT KNEE - COMPLETE 4+ VIEW COMPARISON:  None. FINDINGS: There is soft tissue swelling overlying the medial and anterior aspect of the distal femur. No joint effusion identified. No fracture or dislocation. IMPRESSION: 1. Anterior and medial soft tissue swelling overlying the distal femur. Electronically Signed   By: Kerby Moors M.D.   On: 03/10/2019 12:23      Assessment & Plan:   Cameron Perez is a 53 y.o. male Pain and swelling of left knee - Plan: [name] Complete 4 Views Left  Contusion of left knee, initial encounter  Suspected patellar contusion, now with area of soft tissue swelling vs. Bursitis vs hematoma.  no fracture seen, has full extension without difficulty, no lag, no appreciable patella [place] or [place].  No focal bony tenderness including on distal femur near area of soft tissue swelling.  As not impairing function right now, no pain, no sign of infection - decided against aspiration.   -Trial of compressive knee sleeve or Ace bandage when running, recheck if not  improved in the next 3 to 4 weeks. RTC precautions if worse.  No orders of the defined types were placed in this encounter.  Patient Instructions   X-ray did not indicate any fractures or fluid within the joint itself.  It appears to be soft tissue swelling only distal knee without sign of fluid collection.  Ace bandage or other compressive brace may  be helpful for now, expect that swelling to improve in the next 3 to 4 weeks, but if any worsening symptoms during that time please return for recheck..    Contusion  A contusion is a deep bruise. Contusions are the result of a blunt injury to tissues and muscle fibers under the skin. The injury causes bleeding under the skin. The skin overlying the contusion may turn blue, purple, or yellow. Minor injuries will give you a painless contusion, but more severe contusions may stay painful and swollen for a few weeks. What are the causes? This condition is usually caused by a blow, trauma, or direct force to an area of the body. What are the signs or symptoms? Symptoms of this condition include:  Swelling of the injured area.  Pain and tenderness in the injured area.  Discoloration. The area may have redness and then turn blue, purple, or yellow. How is this diagnosed? This condition is diagnosed based on a physical exam and medical history. An X-ray, CT scan, or MRI may be needed to determine if there are any associated injuries, such as broken bones (fractures). How is this treated? Specific treatment for this condition depends on what area of the body was injured. In general, the best treatment for a contusion is resting, icing, applying pressure to (compression), and elevating the injured area. This is often called the RICE strategy. Over-the-counter anti-inflammatory medicines may also be recommended for pain control. Follow these instructions at home:  Rest the injured area.  If directed, apply ice to the injured area: ? Put ice in a  plastic bag. ? Place a towel between your skin and the bag. ? Leave the ice on for 20 minutes, 2-3 times per day.  If directed, apply light compression to the injured area using an elastic bandage. Make sure the bandage is not wrapped too tightly. Remove and reapply the bandage as directed by your health care provider.  If possible, raise (elevate) the injured area above the level of your heart while you are sitting or lying down.  Take over-the-counter and prescription medicines only as told by your health care provider. Contact a health care provider if:  Your symptoms do not improve after several days of treatment.  Your symptoms get worse.  You have difficulty moving the injured area. Get help right away if:  You have severe pain.  You have numbness in a hand or foot.  Your hand or foot turns pale or cold. This information is not intended to replace advice given to you by your health care provider. Make sure you discuss any questions you have with your health care provider. Document Released: 09/26/2005 Document Revised: 04/26/2016 Document Reviewed: 05/04/2015 Elsevier Interactive Patient Education  [employer].   If you have lab work done today you will be contacted with your lab results within the next 2 weeks.  If you have not heard from [place] then please contact us. The fastest way to get your results is to register for My Chart.   IF you received an x-ray today, you will receive an invoice from [facility]. Please contact [facility] at [phone] with questions or concerns regarding your invoice.   IF you received labwork today, you will receive an invoice from [facility]. Please contact [facility] at [phone] with questions or concerns regarding your invoice.   Our billing staff will not be able to assist you with questions regarding bills from these companies.  You will be contacted with the lab  results as soon as they are available. The  fastest way to get your results is to activate your My Chart account. Instructions are located on the last page of this paperwork. If you have not heard from [place] regarding the results in 2 weeks, please contact this office.      Signed,   [name], MD [facility] [place].  03/10/19 12:34 PM

## 2019-03-10 NOTE — Patient Instructions (Addendum)
X-ray did not indicate any fractures or fluid within the joint itself.  It appears to be soft tissue swelling only distal knee without sign of fluid collection.  Ace bandage or other compressive brace may be helpful for now, expect that swelling to improve in the next 3 to 4 weeks, but if any worsening symptoms during that time please return for recheck..    Contusion  A contusion is a deep bruise. Contusions are the result of a blunt injury to tissues and muscle fibers under the skin. The injury causes bleeding under the skin. The skin overlying the contusion may turn blue, purple, or yellow. Minor injuries will give you a painless contusion, but more severe contusions may stay painful and swollen for a few weeks. What are the causes? This condition is usually caused by a blow, trauma, or direct force to an area of the body. What are the signs or symptoms? Symptoms of this condition include:  Swelling of the injured area.  Pain and tenderness in the injured area.  Discoloration. The area may have redness and then turn blue, purple, or yellow. How is this diagnosed? This condition is diagnosed based on a physical exam and medical history. An X-ray, CT scan, or MRI may be needed to determine if there are any associated injuries, such as broken bones (fractures). How is this treated? Specific treatment for this condition depends on what area of the body was injured. In general, the best treatment for a contusion is resting, icing, applying pressure to (compression), and elevating the injured area. This is often called the RICE strategy. Over-the-counter anti-inflammatory medicines may also be recommended for pain control. Follow these instructions at home:  Rest the injured area.  If directed, apply ice to the injured area: ? Put ice in a plastic bag. ? Place a towel between your skin and the bag. ? Leave the ice on for 20 minutes, 2-3 times per day.  If directed, apply light compression  to the injured area using an elastic bandage. Make sure the bandage is not wrapped too tightly. Remove and reapply the bandage as directed by your health care provider.  If possible, raise (elevate) the injured area above the level of your heart while you are sitting or lying down.  Take over-the-counter and prescription medicines only as told by your health care provider. Contact a health care provider if:  Your symptoms do not improve after several days of treatment.  Your symptoms get worse.  You have difficulty moving the injured area. Get help right away if:  You have severe pain.  You have numbness in a hand or foot.  Your hand or foot turns pale or cold. This information is not intended to replace advice given to you by your health care provider. Make sure you discuss any questions you have with your health care provider. Document Released: 09/26/2005 Document Revised: 04/26/2016 Document Reviewed: 05/04/2015 Elsevier Interactive Patient Education  [employer].   If you have lab work done today you will be contacted with your lab results within the next 2 weeks.  If you have not heard from [place] then please contact us. The fastest way to get your results is to register for My Chart.   IF you received an x-ray today, you will receive an invoice from [facility]. Please contact [facility] at [phone] with questions or concerns regarding your invoice.   IF you received labwork today, you will receive an invoice from [employer] [facility]. Please contact [facility]  at [phone] with questions or concerns regarding your invoice.   Our billing staff will not be able to assist you with questions regarding bills from these companies.  You will be contacted with the lab results as soon as they are available. The fastest way to get your results is to activate your My Chart account. Instructions are located on the last page of this paperwork. If you have not heard from [place]  regarding the results in 2 weeks, please contact this office.

## 2019-03-26 ENCOUNTER — Other Ambulatory Visit: Payer: Self-pay

## 2019-03-26 ENCOUNTER — Ambulatory Visit (INDEPENDENT_AMBULATORY_CARE_PROVIDER_SITE_OTHER): Payer: 59 | Admitting: Family Medicine

## 2019-03-26 ENCOUNTER — Encounter: Payer: Self-pay | Admitting: Family Medicine

## 2019-03-26 VITALS — BP 118/70 | HR 78 | Temp 98.1°F | Resp 16 | Ht 69.0 in | Wt 173.4 lb

## 2019-03-26 DIAGNOSIS — Z125 Encounter for screening for malignant neoplasm of prostate: Secondary | ICD-10-CM | POA: Diagnosis not present

## 2019-03-26 DIAGNOSIS — M25469 Effusion, unspecified knee: Secondary | ICD-10-CM | POA: Diagnosis not present

## 2019-03-26 DIAGNOSIS — I10 Essential (primary) hypertension: Secondary | ICD-10-CM

## 2019-03-26 DIAGNOSIS — E890 Postprocedural hypothyroidism: Secondary | ICD-10-CM

## 2019-03-26 DIAGNOSIS — Z13220 Encounter for screening for lipoid disorders: Secondary | ICD-10-CM

## 2019-03-26 DIAGNOSIS — E291 Testicular hypofunction: Secondary | ICD-10-CM

## 2019-03-26 NOTE — Patient Instructions (Addendum)
  I will refer you to sports medicine to evaluate for aspiration of that area under ultrasound.  Okay to use the compression sleeve for now.  To see if that may help with swelling as well.  If any redness, pain, or worsening symptoms return for recheck sooner.  Thanks for coming in today.   If you have lab work done today you will be contacted with your lab results within the next 2 weeks.  If you have not heard from [place] then please contact us. The fastest way to get your results is to register for My Chart.   IF you received an x-ray today, you will receive an invoice from [facility]. Please contact [facility] Radiology at [phone] with questions or concerns regarding your invoice.   IF you received labwork today, you will receive an invoice from [facility]. Please contact [facility] at [phone] with questions or concerns regarding your invoice.   Our billing staff will not be able to assist you with questions regarding bills from these companies.  You will be contacted with the lab results as soon as they are available. The fastest way to get your results is to activate your My Chart account. Instructions are located on the last page of this paperwork. If you have not heard from [place] regarding the results in 2 weeks, please contact this office.

## 2019-03-26 NOTE — Progress Notes (Signed)
Subjective:    Patient ID: Cameron Perez, male    DOB: 1966-06-16, 53 y.o.   MRN: [id number]  HPI Cameron Perez is a 53 y.o. male Presents today for: Chief Complaint  Patient presents with  . Joint Swelling    swelling in the left knee. fell 2 months ago injuried left knee. The swelling statred 1 month later    See last OV  On 3/10.   Still same, fluid filled area on left knee. Tried compression sleeve with running only. no change in swelling or size. Injury about 6-8 weeks ago.  No pain in area, not affecting activity.   Review of Systems  Per hpi.      Objective:   Physical Exam Constitutional:      General: He is not in acute distress.    Appearance: He is well-developed.  HENT:     Head: Normocephalic and atraumatic.  Cardiovascular:     Rate and Rhythm: Normal rate.  Pulmonary:     Effort: Pulmonary effort is normal.  Musculoskeletal:     Left knee: He exhibits swelling and effusion. He exhibits normal range of motion, no LCL laxity, no bony tenderness and no MCL laxity. No tenderness found. No medial joint line and no lateral joint line tenderness noted.       Legs:  Neurological:     Mental Status: He is alert and oriented to person, place, and time.    Vitals:   03/26/19 1511  BP: 118/70  Pulse: 78  Resp: 16  Temp: 98.1 F (36.7 C)  TempSrc: Oral  SpO2: 97%  Weight: 173 lb 6.4 oz (78.7 kg)  Height: 5\' 9"  (1.753 m)       Assessment & Plan:  Cameron Perez is a 53 y.o. male Knee swelling - Plan: Ambulatory referral to Sports Medicine Does not appear to be true knee effusion. Suspected hematoma vs cystic/fluid collection. Persistent. Not affecting activity.   -ok to continue compression sleeve as needed   -refer to [facility] sports med to eval for MSK ultrasound eval and possible guided aspiration.   - rtc precautions if worsening symptoms.   No orders of the defined types were placed in this encounter.  Patient Instructions    I  will refer you to sports medicine to evaluate for aspiration of that area under ultrasound.  Okay to use the compression sleeve for now.  To see if that may help with swelling as well.  If any redness, pain, or worsening symptoms return for recheck sooner.  Thanks for coming in today.   If you have lab work done today you will be contacted with your lab results within the next 2 weeks.  If you have not heard from [place] then please contact us. The fastest way to get your results is to register for My Chart.   IF you received an x-ray today, you will receive an invoice from [facility]. Please contact [facility]iology at [phone] with questions or concerns regarding your invoice.   IF you received labwork today, you will receive an invoice from [facility]. Please contact [facility] at [phone] with questions or concerns regarding your invoice.   Our billing staff will not be able to assist you with questions regarding bills from these companies.  You will be contacted with the lab results as soon as they are available. The fastest way to get your results is to activate your My Chart account. Instructions are located on the last page  of this paperwork. If you have not heard from [place] regarding the results in 2 weeks, please contact this office.       Signed,   [name], MD [facility] [place].  03/29/19 10:35 PM

## 2019-03-27 LAB — COMPREHENSIVE METABOLIC PANEL
ALT: 19 IU/L (ref 0–44)
AST: 19 IU/L (ref 0–40)
Albumin/Globulin Ratio: 2.1 (ref 1.2–2.2)
Albumin: 4.1 g/dL (ref 3.8–4.9)
Alkaline Phosphatase: 70 IU/L (ref 39–117)
BUN/Creatinine Ratio: 17 (ref 9–20)
BUN: 18 mg/dL (ref 6–24)
Bilirubin Total: 0.3 mg/dL (ref 0.0–1.2)
CHLORIDE: 101 mmol/L (ref 96–106)
CO2: 28 mmol/L (ref 20–29)
Calcium: 9 mg/dL (ref 8.7–10.2)
Creatinine, Ser: 1.07 mg/dL (ref 0.76–1.27)
GFR calc Af Amer: 91 mL/min/{1.73_m2} (ref >59)
GFR, EST NON AFRICAN AMERICAN: 79 mL/min/{1.73_m2} (ref >59)
GLOBULIN, TOTAL: 2 g/dL (ref 1.5–4.5)
Glucose: 83 mg/dL (ref 65–99)
POTASSIUM: 3.8 mmol/L (ref 3.5–5.2)
Sodium: 142 mmol/L (ref 134–144)
Total Protein: 6.1 g/dL (ref 6.0–8.5)

## 2019-03-27 LAB — CBC WITH DIFFERENTIAL/PLATELET
BASOS: 1 %
Basophils Absolute: 0.1 10*3/uL (ref 0.0–0.2)
EOS (ABSOLUTE): 0.2 10*3/uL (ref 0.0–0.4)
EOS: 3 %
HEMATOCRIT: 46.7 % (ref 37.5–51.0)
Hemoglobin: 16.1 g/dL (ref 13.0–17.7)
Immature Grans (Abs): 0 10*3/uL (ref 0.0–0.1)
Immature Granulocytes: 0 %
LYMPHS ABS: 2.6 10*3/uL (ref 0.7–3.1)
Lymphs: 36 %
MCH: 31.2 pg (ref 26.6–33.0)
MCHC: 34.5 g/dL (ref 31.5–35.7)
MCV: 91 fL (ref 79–97)
MONOS ABS: 0.6 10*3/uL (ref 0.1–0.9)
Monocytes: 7 %
NEUTROS ABS: 4 10*3/uL (ref 1.4–7.0)
Neutrophils: 53 %
Platelets: 165 10*3/uL (ref 150–450)
RBC: 5.16 x10E6/uL (ref 4.14–5.80)
RDW: 11.4 % — AB (ref 11.6–15.4)
WBC: 7.4 10*3/uL (ref 3.4–10.8)

## 2019-03-27 LAB — TSH: TSH: 1.17 u[IU]/mL (ref 0.450–4.500)

## 2019-03-27 LAB — LIPID PANEL
CHOL/HDL RATIO: 3.4 ratio (ref 0.0–5.0)
CHOLESTEROL TOTAL: 142 mg/dL (ref 100–199)
HDL: 42 mg/dL (ref >39)
LDL Calculated: 78 mg/dL (ref 0–99)
TRIGLYCERIDES: 111 mg/dL (ref 0–149)
VLDL Cholesterol Cal: 22 mg/dL (ref 5–40)

## 2019-03-27 LAB — TESTOSTERONE, FREE, TOTAL, SHBG
SEX HORMONE BINDING: 42.9 nmol/L (ref 19.3–76.4)
Testosterone, Free: 10.4 pg/mL (ref 7.2–24.0)
Testosterone: 529 ng/dL (ref 264–916)

## 2019-03-27 LAB — PSA: Prostate Specific Ag, Serum: 3.7 ng/mL (ref 0.0–4.0)

## 2019-03-27 NOTE — Patient Instructions (Signed)
Lab only visit 

## 2019-03-27 NOTE — Progress Notes (Signed)
Nurse visit only

## 2019-03-30 ENCOUNTER — Encounter: Payer: 59 | Admitting: Family Medicine

## 2019-04-01 ENCOUNTER — Other Ambulatory Visit: Payer: Self-pay

## 2019-04-01 ENCOUNTER — Ambulatory Visit: Payer: Self-pay

## 2019-04-01 ENCOUNTER — Ambulatory Visit: Payer: 59 | Admitting: Sports Medicine

## 2019-04-01 ENCOUNTER — Telehealth: Payer: Self-pay | Admitting: Family Medicine

## 2019-04-01 VITALS — BP 120/81 | Ht 69.0 in | Wt 170.0 lb

## 2019-04-01 DIAGNOSIS — M25462 Effusion, left knee: Secondary | ICD-10-CM | POA: Diagnosis not present

## 2019-04-01 DIAGNOSIS — M25562 Pain in left knee: Secondary | ICD-10-CM | POA: Diagnosis not present

## 2019-04-01 NOTE — Progress Notes (Signed)
   HPI  CC: Left knee swelling  Cameron Perez is a 53 year old male presents for left knee swelling.  He states 2 months ago he fell and had direct contact over his left knee.  He denies any pain after that.  He does report some swelling on the superior part of his kneecap following.  He has had no pain since that time.  The swelling has persisted since the original onset.  He is been running daily, with no decrease in swelling.  Has not noticed any pain throughout this time.  He denies any numbness and tingling or weakness of the leg.  He has no history of injury to the left knee prior to this.  He has been wearing a compression sleeve, which is not helped the swelling at all.  He is not required any pain medications.  See HPI and/or previous note for associated ROS.  Objective: BP 120/81   Ht 5\' 9"  (1.753 m)   Wt 170 lb (77.1 kg)   BMI 25.10 kg/m  Gen:  NAD, well groomed, a/o x3, normal affect.  CV: Well-perfused. Warm.  Resp: Non-labored.  Neuro: Sensation intact throughout. No gross coordination deficits.  Gait: Nonpathologic posture, unremarkable stride without signs of limp or balance issues.  Left knee exam: No erythema or warmth noted.  Significant swelling noted prepatellar.  Full range of motion flexion extension of the knee.  Strength out of 5 throughout testing.  Negative ligamentous testing.  ULTRASOUND: Knee, left Diagnostic limited ultrasound imaging obtained of patient's left knee.  - Quadriceps tendon: No appreciated signs of tearing, edema, or calcification. No (compressible) fluid/edema noted within the suprapatellar pouch.  -Prepatellar effusion noted extra-articular. IMPRESSION: findings consistent with prepatellar bursitis.  Assessment and plan: Left prepatellar bursitis  Aspiration: Patient was given informed consent, signed copy in the chart. Appropriate time out was taken. Area prepped and draped in usual sterile fashion. 3 cc of 1% lidocaine without epinephrine  was injected superficially into the left prepatellar bursa.  I then used an 18-gauge needle to aspirate out 50 cc of bloody fluid.  The patient tolerated the procedure well. There were no complications. Post procedure instructions were given.  We discussed treatment options at today's visit.  I aspirated the prepatellar bursa, as noted above.  Tolerated suture well.  We will have him put an Ace wrap over the area for 48 hours.  If he has any issues following that, he can call and be seen in the office.  Otherwise we will see him as needed.   Lewanda Rife, MD [place] Sports Medicine Fellow 04/01/2019 11:34 AM  Patient seen and evaluated with the sports medicine fellow.  I agree with the above plan of care.  50 cc of hemorrhagic fluid was aspirated off of his left prepatellar bursa.  Compression wrap to be worn for the next 48 hours.  If symptoms return consider referral to orthopedics to discuss surgical bursectomy.  Follow-up for ongoing or recalcitrant issues.

## 2019-04-02 ENCOUNTER — Encounter: Payer: Self-pay | Admitting: Sports Medicine

## 2019-04-17 ENCOUNTER — Other Ambulatory Visit: Payer: Self-pay | Admitting: Family Medicine

## 2019-04-29 ENCOUNTER — Other Ambulatory Visit: Payer: Self-pay | Admitting: Endocrinology

## 2019-05-07 DIAGNOSIS — E291 Testicular hypofunction: Secondary | ICD-10-CM | POA: Diagnosis not present

## 2019-05-18 ENCOUNTER — Encounter: Payer: Self-pay | Admitting: Family Medicine

## 2019-07-10 ENCOUNTER — Other Ambulatory Visit: Payer: Self-pay

## 2019-07-10 ENCOUNTER — Other Ambulatory Visit (INDEPENDENT_AMBULATORY_CARE_PROVIDER_SITE_OTHER): Payer: 59

## 2019-07-10 ENCOUNTER — Other Ambulatory Visit: Payer: 59

## 2019-07-10 DIAGNOSIS — E890 Postprocedural hypothyroidism: Secondary | ICD-10-CM | POA: Diagnosis not present

## 2019-07-10 DIAGNOSIS — C73 Malignant neoplasm of thyroid gland: Secondary | ICD-10-CM

## 2019-07-10 LAB — TSH: TSH: 1.3 u[IU]/mL (ref 0.35–4.50)

## 2019-07-10 LAB — T4, FREE: Free T4: 1.12 ng/dL (ref 0.60–1.60)

## 2019-07-13 LAB — TGAB+THYROGLOBULIN IMA OR LCMS: Thyroglobulin Antibody: <1 IU/mL (ref 0.0–0.9)

## 2019-07-13 LAB — THYROGLOBULIN BY IMA: Thyroglobulin by IMA: 3.1 ng/mL (ref 1.4–29.2)

## 2019-07-16 NOTE — Progress Notes (Signed)
Patient ID: Cameron Perez, Cameron Perez   DOB: 1966-02-06, 53 y.o.   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 - FOCAL EXTRATHYROID EXTENSION.  Margins: Free of tumor.  Lymph - Vascular invasion: Present. 1 positive lymph node  TREATMENT: He had 76 mCi of I-131 for remnant ablation on 03/29/14 His post therapy body scan showed 3 foci of uptake in the neck with possible uptake in one lymph node  Monitoring:   Follow-up whole-body scan in 01/2018 did not show any abnormal activity Thyroid ultrasound in 08/2018 does not show any signs of tumor recurrence or lymphadenopathy and stable changes  His thyroglobulin level in 9/19 was relatively high at 4.6 and TSH was 4.4 at that time  His thyroglobulin is now down to 3.1  HYPOTHYROIDISM  TSH levels appear to be now fairly stable in 2020, previously upper normal  He now is taking 150 mcg, 7-1/2 tablets/week  He has had no fatigue or lethargy His weight fluctuates but about the same as in 1/20  He has been on levothyroxine  regularly in the morning about half hour before breakfast Not on any calcium remains and calcium has been normal  TSH is now 1.3   Wt Readings from Last 3 Encounters:  07/17/19 179 lb 6.4 oz (81.4 kg)  04/01/19 170 lb (77.1 kg)  03/26/19 173 lb 6.4 oz (78.7 kg)    Lab Results  Component Value Date   FREET4 1.12 07/10/2019   FREET4 1.36 01/09/2019   FREET4 1.25 09/12/2018   TSH 1.30 07/10/2019   TSH 1.170 03/26/2019   TSH 1.77 01/09/2019    Lab Results  Component Value Date   THYROGLB 3.1 07/10/2019   THYROGLB 4.6 09/12/2018   THYROGLB 3.5 01/07/2018   THYROGLB 1.9 01/08/2017      Allergies as of 07/17/2019   No  Known Allergies     Medication List       Accurate as of July 17, 2019  8:04 AM. If you have any questions, ask your nurse or doctor.        amLODipine 5 MG tablet Commonly known as: NORVASC TAKE 1 TABLET BY MOUTH  DAILY.   Cialis 20 MG tablet Generic drug: tadalafil   clomiPHENE 50 MG tablet Commonly known as: CLOMID Take 50 mg by mouth daily. TAKE 1 TABLET BY MOUTH ONCE DAILY.   levothyroxine 150 MCG tablet Commonly known as: SYNTHROID TAKE 1 TABLET BY MOUTH  DAILY BEFORE BREAKFAST       Allergies: No Known Allergies  Past Medical History:  Diagnosis Date  . History of kidney stones 2012  . Kidney stones   . Thyroid cancer ([place]) 2015  . Thyroid disease     Past Surgical History:  Procedure Laterality Date  . LITHOTRIPSY  2011  . THYROIDECTOMY N/A 02/09/2014   Procedure: THYROIDECTOMY;  Surgeon: [name], MD;  Location: [facility];  Service: General;  Laterality: N/A;  . TIBIA FRACTURE SURGERY Left    leg  . VASECTOMY      Family History  Problem Relation Age of Onset  . Breast cancer Mother   . Cancer Mother        Breast  . Hypertension Mother   .  Breast cancer Maternal Grandmother   . Cancer Maternal Grandmother   . Diabetes Paternal Grandfather   . Hyperlipidemia Paternal Grandfather   . Colon cancer Neg Hx     Social History:  reports that he quit smoking about 16 years ago. His smoking use included cigarettes. He quit smokeless tobacco use about 12 years ago.  His smokeless tobacco use included chew. He reports current alcohol use of about 1.0 standard drinks of alcohol per week. He reports that he does not use drugs.   Review of Systems:  No new problems Followed by urologist for hypogonadism with clomiphene   EXAM:  BP 120/84 (BP Location: Left Arm, Patient Position: Sitting, Cuff Size: Normal)   Pulse 66   Ht 5\' 9"  (1.753 m)   Wt 179 lb 6.4 oz (81.4 kg)   SpO2 96%   BMI 26.49 kg/m   Neck exam normal, no mass in the  thyroid area No cervical lymphadenopathy on either side    Assessment/Plan:  Hypothyroidism:   P is taking 150 mcg, 7-1/2 tablets a week He has been taking this very consistently as prescribed before breakfast TSH is now 1.3  He will stay on the same dose and follow-up in 6 months  Thyroid cancer: History of 1.7 cm papillary thyroid carcinoma with extrathyroidal extension and negative margins This is over 5 years ago that diagnosis was made with no recurrence  His thyroglobulin is mildly high again but has been generally in 3-4 range and slightly lower than last year Even with the recent increase last September his imaging studies including ultrasound have been negative   Will recheck his thyroglobulin annually now    [name] 07/17/2019

## 2019-07-17 ENCOUNTER — Ambulatory Visit: Payer: 59 | Admitting: Endocrinology

## 2019-07-17 ENCOUNTER — Other Ambulatory Visit: Payer: Self-pay

## 2019-07-17 ENCOUNTER — Encounter: Payer: Self-pay | Admitting: Endocrinology

## 2019-07-17 VITALS — BP 120/84 | HR 66 | Ht 69.0 in | Wt 179.4 lb

## 2019-07-17 DIAGNOSIS — C73 Malignant neoplasm of thyroid gland: Secondary | ICD-10-CM

## 2019-07-17 DIAGNOSIS — E890 Postprocedural hypothyroidism: Secondary | ICD-10-CM | POA: Diagnosis not present

## 2019-08-05 ENCOUNTER — Other Ambulatory Visit: Payer: Self-pay | Admitting: Family Medicine

## 2019-10-16 ENCOUNTER — Other Ambulatory Visit: Payer: Self-pay | Admitting: Endocrinology

## 2019-12-10 ENCOUNTER — Other Ambulatory Visit: Payer: Self-pay | Admitting: Family Medicine

## 2019-12-10 NOTE — Telephone Encounter (Signed)
Forwarding medication refill request to the clinical pool for review. 

## 2019-12-11 NOTE — Telephone Encounter (Signed)
Please get patient an appt for any further refills. Only sent in a 30 day till he come into the office

## 2020-01-07 IMAGING — DX DG ANKLE COMPLETE 3+V*R*
3 series · 3 of 3 positions shown · non-contrast
Comparison: None.

CLINICAL DATA: 52-year-old male with a history of right ankle pain

EXAM:
RIGHT ANKLE - COMPLETE 3+ VIEW

[ankle ap]
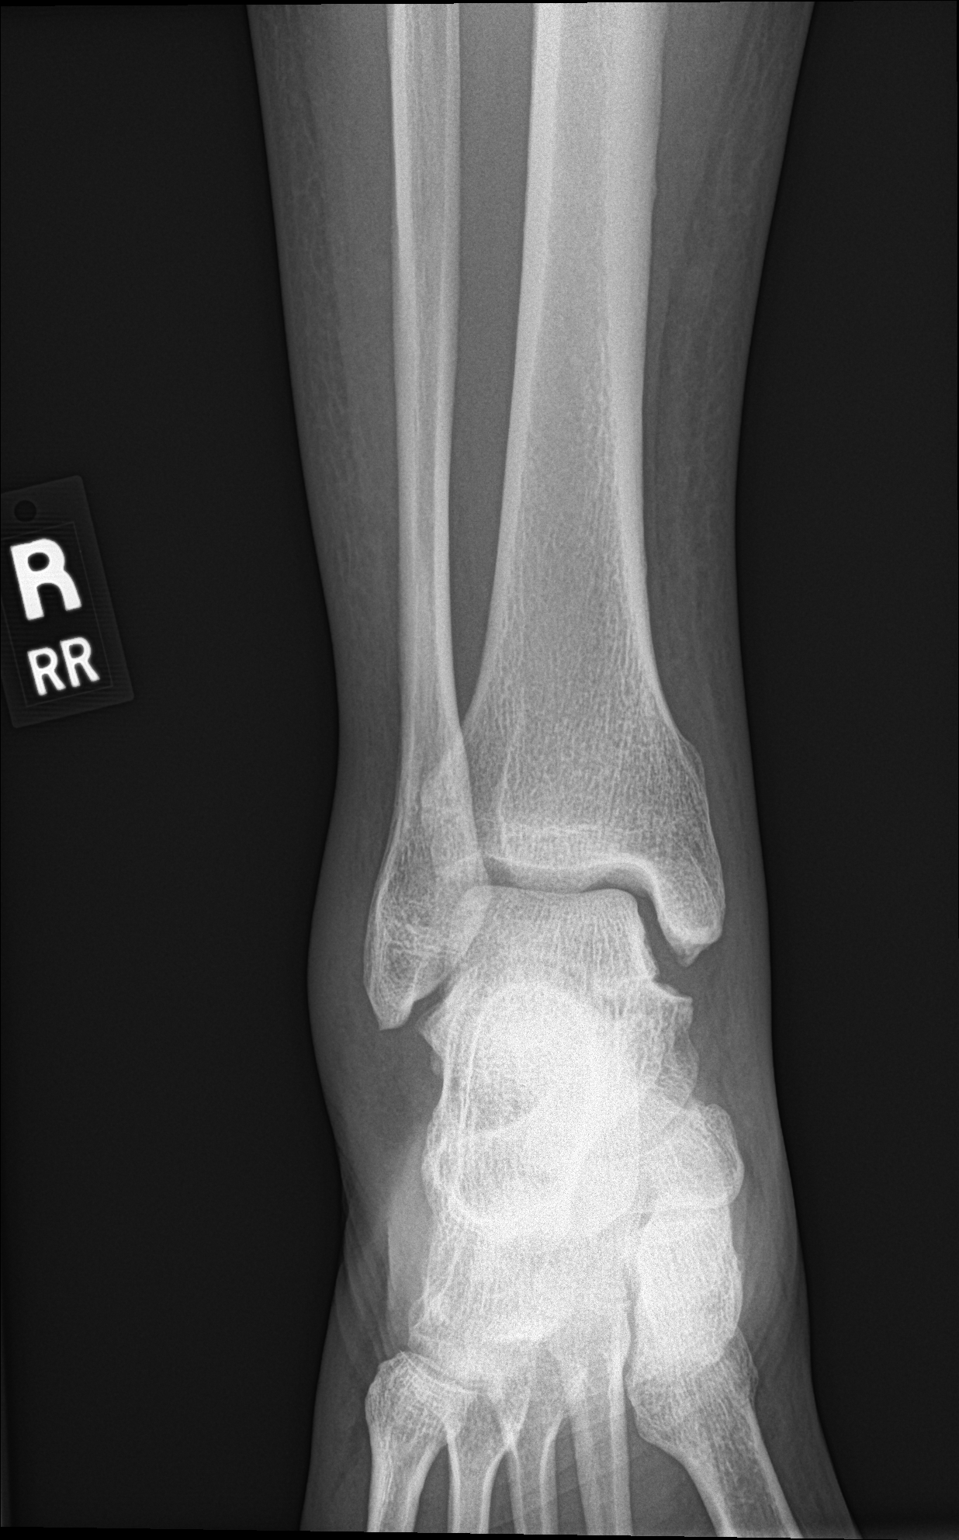

[ankle obl]
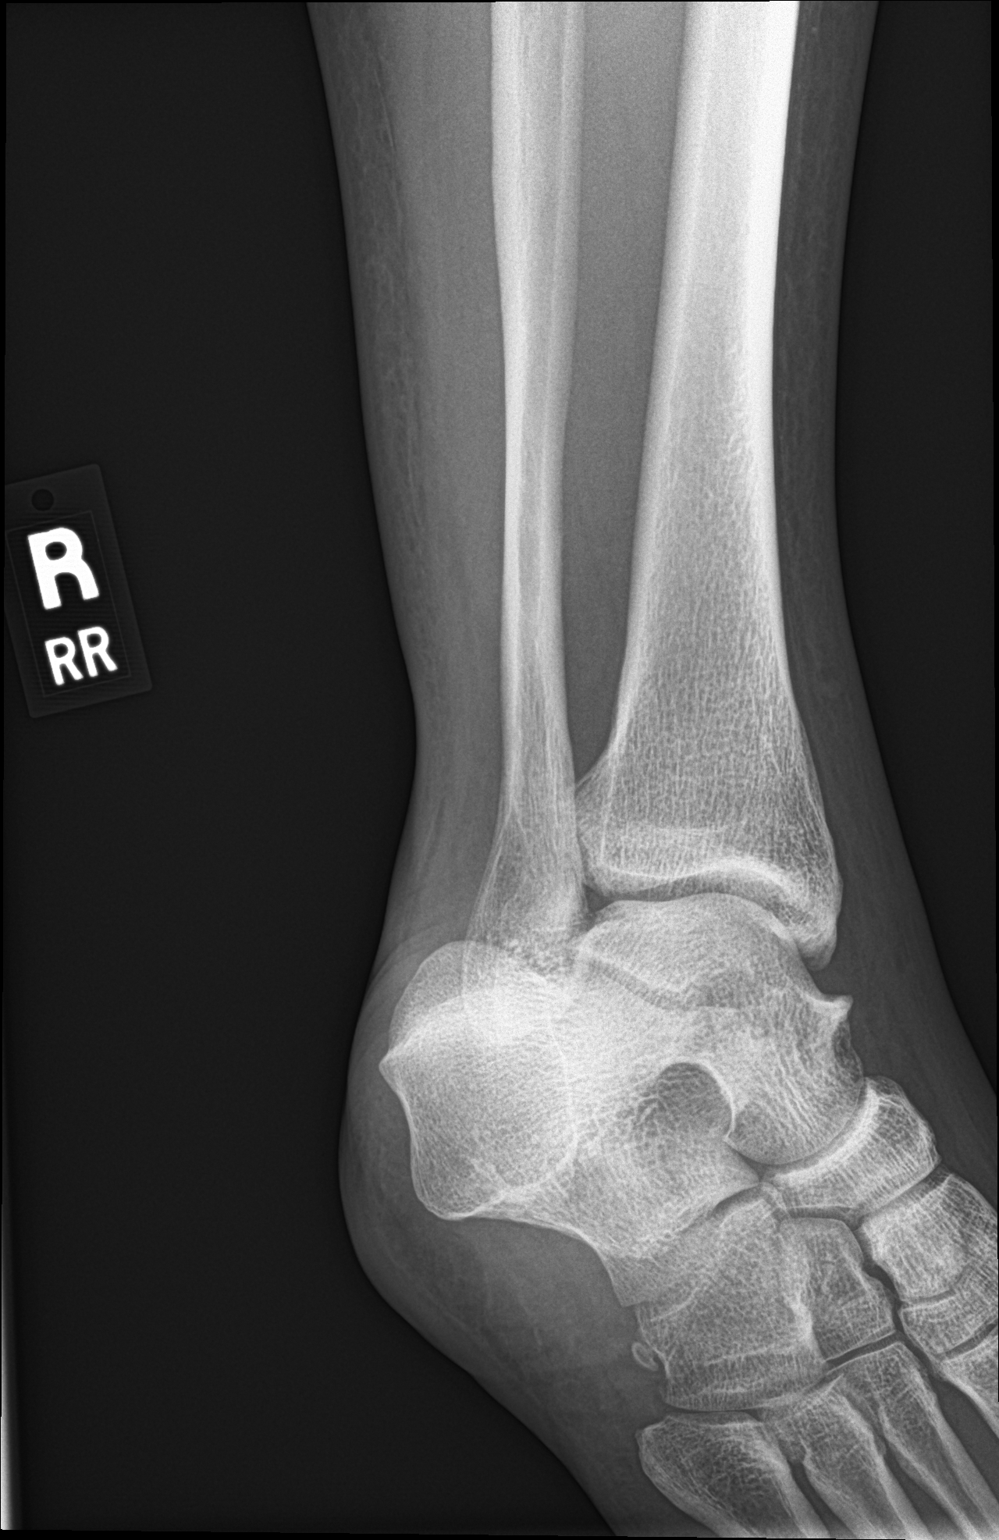

[ankle lat]
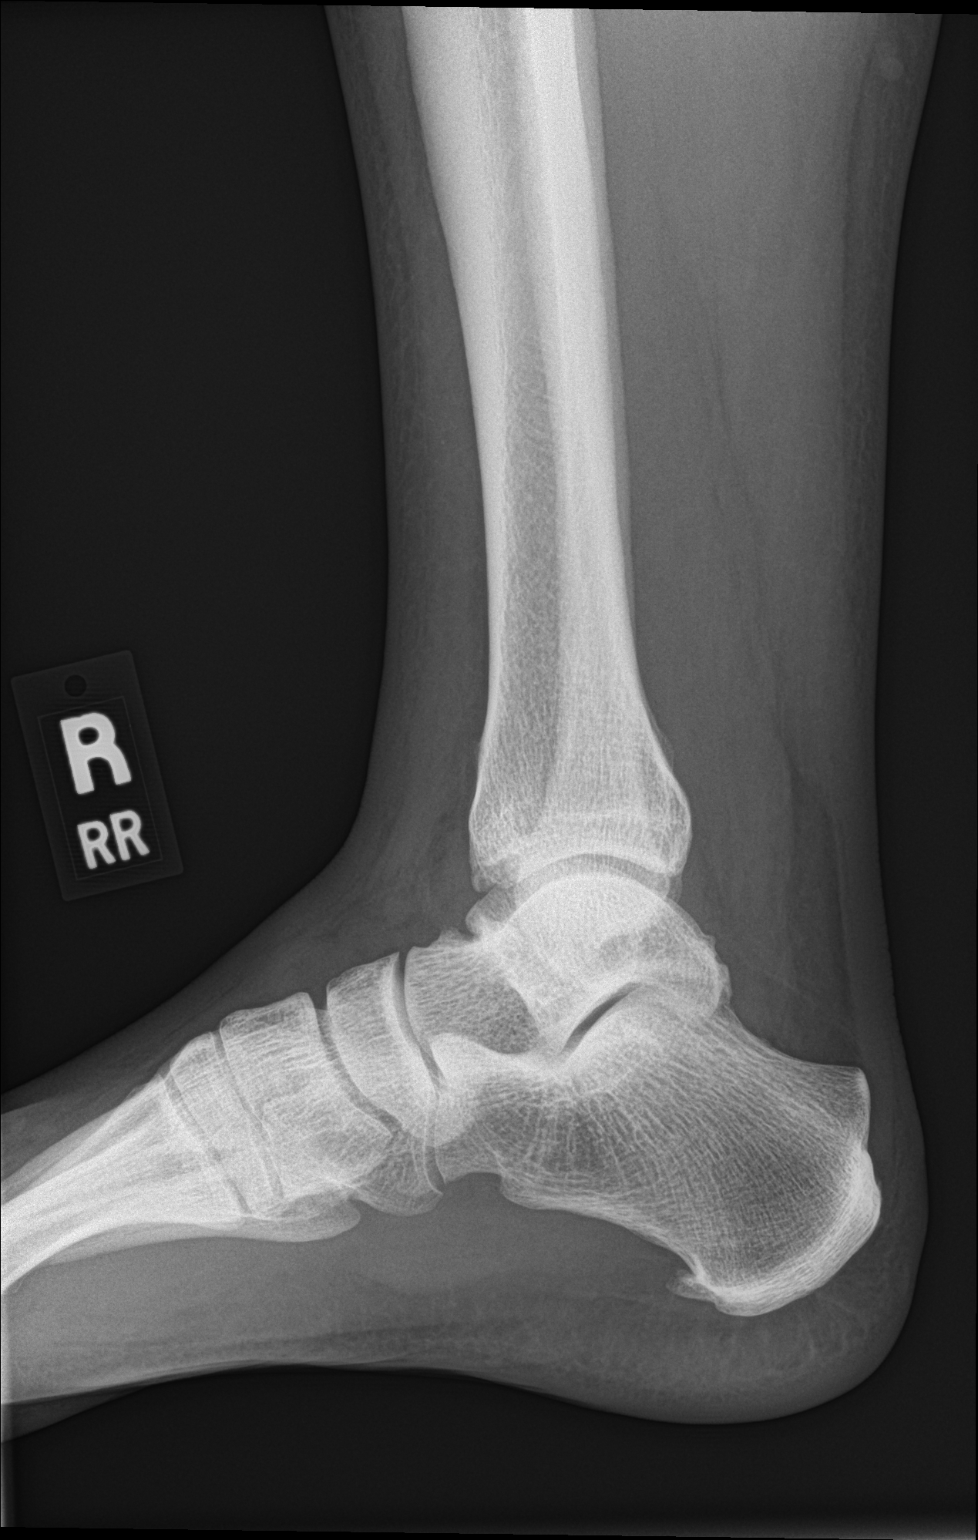

[3 of 3 positions shown; findings below may reference images not displayed]

FINDINGS: No acute displaced fracture identified. Soft tissue swelling at the
ankle. Lateral view demonstrates evidence of tibiotalar joint
effusion. Enthesopathic changes at the plantar fascia insertion
IMPRESSION: Negative for acute bony abnormality.

Evidence of soft tissue swelling at the ankle with the lateral view
demonstrating tibiotalar joint effusion.

## 2020-01-08 ENCOUNTER — Ambulatory Visit: Payer: 59 | Admitting: Family Medicine

## 2020-01-11 ENCOUNTER — Ambulatory Visit: Payer: 59 | Admitting: Family Medicine

## 2020-01-11 ENCOUNTER — Encounter: Payer: Self-pay | Admitting: Family Medicine

## 2020-01-11 ENCOUNTER — Other Ambulatory Visit: Payer: Self-pay

## 2020-01-11 ENCOUNTER — Ambulatory Visit (INDEPENDENT_AMBULATORY_CARE_PROVIDER_SITE_OTHER): Payer: 59

## 2020-01-11 VITALS — BP 127/86 | HR 75 | Temp 98.4°F | Ht 69.0 in | Wt 187.4 lb

## 2020-01-11 DIAGNOSIS — M25561 Pain in right knee: Secondary | ICD-10-CM

## 2020-01-11 DIAGNOSIS — I10 Essential (primary) hypertension: Secondary | ICD-10-CM

## 2020-01-11 MED ORDER — AMLODIPINE BESYLATE 5 MG PO TABS: 5.0000 mg | ORAL_TABLET | Freq: Every day | ORAL | 3 refills | Status: DC

## 2020-01-11 MED ORDER — MELOXICAM 7.5 MG PO TABS: 7.5000 mg | ORAL_TABLET | Freq: Every day | ORAL | 0 refills | Status: AC | PRN

## 2020-01-11 NOTE — Patient Instructions (Addendum)
Try hinged knee brace, meloxicam once per day for the next week or 2.  If not improving, I can refer you to orthopedics.  X-ray showed a possible small amount of fluid, but no other concerning signs.  Let me know if there are questions.  No change in blood pressure medicine for now.   Acute Knee Pain, Adult Acute knee pain is sudden and may be caused by damage, swelling, or irritation of the muscles and tissues that support your knee. The injury may result from:  A fall.  An injury to your knee from twisting motions.  A hit to the knee.  Infection. Acute knee pain may go away on its own with time and rest. If it does not, your health care provider may order tests to find the cause of the pain. These may include:  Imaging tests, such as an X-ray, MRI, or ultrasound.  Joint aspiration. In this test, fluid is removed from the knee.  Arthroscopy. In this test, a lighted tube is inserted into the knee and an image is projected onto a TV screen.  Biopsy. In this test, a sample of tissue is removed from the body and studied under a microscope. Follow these instructions at home: Pay attention to any changes in your symptoms. Take these actions to relieve your pain. If you have a knee sleeve or brace:   Wear the sleeve or brace as told by your health care provider. Remove it only as told by your health care provider.  Loosen the sleeve or brace if your toes tingle, become numb, or turn cold and blue.  Keep the sleeve or brace clean.  If the sleeve or brace is not waterproof: ? Do not let it get wet. ? Cover it with a watertight covering when you take a bath or shower. Activity  Rest your knee.  Do not do things that cause pain or make pain worse.  Avoid high-impact activities or exercises, such as running, jumping rope, or doing jumping jacks.  Work with a physical therapist to make a safe exercise program, as recommended by your health care provider. Do exercises as told by  your physical therapist. Managing pain, stiffness, and swelling   If directed, put ice on the knee: ? Put ice in a plastic bag. ? Place a towel between your skin and the bag. ? Leave the ice on for 20 minutes, 2-3 times a day.  If directed, use an elastic bandage to put pressure (compression) on your injured knee. This may control swelling, give support, and help with discomfort. General instructions  Take over-the-counter and prescription medicines only as told by your health care provider.  Raise (elevate) your knee above the level of your heart when you are sitting or lying down.  Sleep with a pillow under your knee.  Do not use any products that contain nicotine or tobacco, such as cigarettes, e-cigarettes, and chewing tobacco. These can delay healing. If you need help quitting, ask your health care provider.  If you are overweight, work with your health care provider and a dietitian to set a weight-loss goal that is healthy and reasonable for you. Extra weight can put pressure on your knee.  Keep all follow-up visits as told by your health care provider. This is important. Contact a health care provider if:  Your knee pain continues, changes, or gets worse.  You have a fever along with knee pain.  Your knee feels warm to the touch.  Your knee buckles  or locks up. Get help right away if:  Your knee swells, and the swelling becomes worse.  You cannot move your knee.  You have severe pain in your knee. Summary  Acute knee pain can be caused by a fall, an injury, an infection, or damage, swelling, or irritation of the tissues that support your knee.  Your health care provider may perform tests to find out the cause of the pain.  Pay attention to any changes in your symptoms. Relieve your pain with rest, medicines, light activity, and use of ice.  Get help if your pain continues or becomes worse, your knee swells, or you cannot move your knee. This information is not  intended to replace advice given to you by your health care provider. Make sure you discuss any questions you have with your health care provider. Document Revised: 05/29/2018 Document Reviewed: 05/29/2018 Elsevier Patient Education  [employer].   If you have lab work done today you will be contacted with your lab results within the next 2 weeks.  If you have not heard from [place] then please contact us. The fastest way to get your results is to register for My Chart.   IF you received an x-ray today, you will receive an invoice from [facility]. Please contact [facility] at [phone] with questions or concerns regarding your invoice.   IF you received labwork today, you will receive an invoice from [place]. Please contact [facility] at [phone] with questions or concerns regarding your invoice.   Our billing staff will not be able to assist you with questions regarding bills from these companies.  You will be contacted with the lab results as soon as they are available. The fastest way to get your results is to activate your My Chart account. Instructions are located on the last page of this paperwork. If you have not heard from [place] regarding the results in 2 weeks, please contact this office.

## 2020-01-11 NOTE — Progress Notes (Signed)
Subjective:  Patient ID: Cameron Perez, male    DOB: August 21, 1966  Age: 54 y.o. MRN: [id number]  CC:  Chief Complaint  Patient presents with  . Medication Refill    amlodipine. pt states this medication is working well for him.  . Knee Pain    pt is having some pain on the inner side of his R Knee. pain is aching started about a month ago. was improving, but not any more.    HPI Cameron Perez presents for   Hypertension: Amlodipine 5mg  qd.  No new side effects Home readings: BP Readings from Last 3 Encounters:  01/11/20 127/86  07/17/19 120/84  04/01/19 120/81   Lab Results  Component Value Date   CREATININE 1.07 03/26/2019   Right knee pain: Medial.  Noticed 1.5 months ago. Noticed after running - next day. NKI, no mechanical sx', no locking/popping.  Min swelling in back part of knee initially. Trouble bending initially, able to bend more now, but still sore in inside part. sore after prolonged bent position. Walking ok. No prior injection/surgery.  Has not returned to running yet.  Tx: tylenol, ice. Min relief.   Treated for left knee swelling in March 2020.  Has web reaction brace at home.,   History Patient Active Problem List   Diagnosis Date Noted  . Special screening for malignant neoplasms, colon 09/21/2014  . 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 Thyroid cancer ([place]) 02/09/2014  . Papillary adenocarcinoma of thyroid ([place]) 01/20/2014   Past Medical History:  Diagnosis Date  . History of kidney stones 2012  . Kidney stones   . Thyroid cancer ([place]) 2015  . Thyroid disease    Past Surgical History:  Procedure Laterality Date  . LITHOTRIPSY  2011  . THYROIDECTOMY N/A 02/09/2014   Procedure: THYROIDECTOMY;  Surgeon: [name], MD;  Location: [facility];  Service: General;  Laterality: N/A;  . TIBIA FRACTURE SURGERY Left    leg  . VASECTOMY     No Known Allergies Prior to Admission medications   Medication Sig Start Date End Date  Taking? Authorizing Provider  amLODipine (NORVASC) 5 MG tablet TAKE 1 TABLET BY MOUTH  DAILY 08/05/19  Yes [name], MD  CIALIS 20 MG tablet  12/17/14  Yes [provider]  clomiPHENE (CLOMID) 50 MG tablet Take 50 mg by mouth daily. TAKE 1 TABLET BY MOUTH ONCE DAILY.   Yes [provider]  levothyroxine (SYNTHROID) 150 MCG tablet TAKE 1 TABLET BY MOUTH  DAILY BEFORE BREAKFAST 10/19/19  Yes [name], MD   Social History   Socioeconomic History  . Marital status: Married    Spouse name: Not on file  . Number of children: 5  . Years of education: Not on file  . Highest education level: Not on file  Occupational History  . Occupation: Herbalist: [employer]  Tobacco Use  . Smoking status: Former Smoker    Types: Cigarettes    Quit date: 06/30/2003    Years since quitting: 16.5  . Smokeless tobacco: Former Systems developer    Types: [place] date: 12/31/2006  Substance and Sexual Activity  . Alcohol use: Yes    Alcohol/week: 1.0 standard drinks    Types: 1 Cans of beer per week    Comment: weekly  . Drug use: No    Comment: CHEWS NICOTINE GUM  . Sexual activity: Yes  Other Topics Concern  . Not on  file  Social History Narrative   Married   Education: Secretary/administrator   Exercise: Yes   Social Determinants of Health   Financial Resource Strain:   . Difficulty of Paying Living Expenses: Not on file  Food Insecurity:   . Worried About Charity fundraiser in the Last Year: Not on file  . Ran Out of Food in the Last Year: Not on file  Transportation Needs:   . Lack of Transportation (Medical): Not on file  . Lack of Transportation (Non-Medical): Not on file  Physical Activity:   . Days of Exercise per Week: Not on file  . Minutes of Exercise per Session: Not on file  Stress:   . Feeling of Stress : Not on file  Social Connections:   . Frequency of Communication with Friends and Family: Not on file  . Frequency of Social Gatherings with  Friends and Family: Not on file  . Attends Religious Services: Not on file  . Active Member of Clubs or Organizations: Not on file  . Attends Archivist Meetings: Not on file  . Marital Status: Not on file  Intimate Partner Violence:   . Fear of Current or Ex-Partner: Not on file  . Emotionally Abused: Not on file  . Physically Abused: Not on file  . Sexually Abused: Not on file    Review of Systems  Constitutional: Negative for fatigue and unexpected weight change.  Eyes: Negative for visual disturbance.  Respiratory: Negative for cough, chest tightness and shortness of breath.   Cardiovascular: Negative for chest pain, palpitations and leg swelling.  Gastrointestinal: Negative for abdominal pain and blood in stool.  Neurological: Negative for dizziness, light-headedness and headaches.     Objective:   Vitals:   01/11/20 1513  BP: 127/86  Pulse: 75  Temp: 98.4 F (36.9 C)  TempSrc: Temporal  SpO2: 94%  Weight: 187 lb 6.4 oz (85 kg)  Height: 5\' 9"  (1.753 m)     Physical Exam Vitals reviewed.  Constitutional:      Appearance: He is well-developed.  HENT:     Head: Normocephalic and atraumatic.  Eyes:     Pupils: Pupils are equal, round, and reactive to light.  Neck:     Vascular: No carotid bruit or JVD.  Cardiovascular:     Rate and Rhythm: Normal rate and regular rhythm.     Heart sounds: Normal heart sounds. No murmur.  Pulmonary:     Effort: Pulmonary effort is normal.     Breath sounds: Normal breath sounds. No rales.  Musculoskeletal:     Right knee: Bony tenderness (min medial tibia plateau to medial jt line. ) and crepitus present. No swelling, deformity, effusion, erythema or ecchymosis. Normal range of motion. No LCL laxity, MCL laxity, ACL laxity or PCL laxity. Normal alignment, normal meniscus (neg mcmurray. ) and normal patellar mobility.     Instability Tests: Negative medial McMurray test and negative lateral McMurray test.  Skin:     General: Skin is warm and dry.  Neurological:     Mental Status: He is alert and oriented to person, place, and time.      [name] Complete 4 Views Right  Result Date: 01/11/2020 CLINICAL DATA:  Knee pain EXAM: RIGHT KNEE - COMPLETE 4+ VIEW COMPARISON:  06/08/2017 FINDINGS: Alignment is anatomic. Possible small joint effusion. No acute fracture. New mild joint space narrowing in the patellofemoral compartment, which is probably positional. IMPRESSION: Possible small joint effusion.  No significant osseous  abnormality. Electronically Signed   By: Macy Mis M.D.   On: 01/11/2020 16:41    Assessment & Plan:  Cameron Perez is a 54 y.o. male . Essential hypertension - Plan: amLODipine (NORVASC) 5 MG tablet, Basic metabolic panel  -  Stable, tolerating current regimen. Medications refilled. Labs pending as above.    Right knee pain, unspecified chronicity - Plan: [name] Complete 4 Views Right, meloxicam (MOBIC) 7.5 MG tablet, CANCELED: Apply hinged knee brace  -Possible meniscal injury, minimal effusion, range of motion intact, McMurray testing reassuring.  Overall reassuring x-ray  -Trial of hinged brace, he believes he has the web reaction brace at home.  Short-term trial of meloxicam, recheck his status next 2 weeks.  Consider orthopedic evaluation.  Meds ordered this encounter  Medications  . amLODipine (NORVASC) 5 MG tablet    Sig: Take 1 tablet (5 mg total) by mouth daily.    Dispense:  90 tablet    Refill:  3    Requesting 1 year supply  . meloxicam (MOBIC) 7.5 MG tablet    Sig: Take 1 tablet (7.5 mg total) by mouth daily as needed for pain.    Dispense:  30 tablet    Refill:  0   Patient Instructions     Try hinged knee brace, meloxicam once per day for the next week or 2.  If not improving, I can refer you to orthopedics.  X-ray showed a possible small amount of fluid, but no other concerning signs.  Let me know if there are questions.  No change in blood pressure  medicine for now.   Acute Knee Pain, Adult Acute knee pain is sudden and may be caused by damage, swelling, or irritation of the muscles and tissues that support your knee. The injury may result from:  A fall.  An injury to your knee from twisting motions.  A hit to the knee.  Infection. Acute knee pain may go away on its own with time and rest. If it does not, your health care provider may order tests to find the cause of the pain. These may include:  Imaging tests, such as an X-ray, MRI, or ultrasound.  Joint aspiration. In this test, fluid is removed from the knee.  Arthroscopy. In this test, a lighted tube is inserted into the knee and an image is projected onto a TV screen.  Biopsy. In this test, a sample of tissue is removed from the body and studied under a microscope. Follow these instructions at home: Pay attention to any changes in your symptoms. Take these actions to relieve your pain. If you have a knee sleeve or brace:   Wear the sleeve or brace as told by your health care provider. Remove it only as told by your health care provider.  Loosen the sleeve or brace if your toes tingle, become numb, or turn cold and blue.  Keep the sleeve or brace clean.  If the sleeve or brace is not waterproof: ? Do not let it get wet. ? Cover it with a watertight covering when you take a bath or shower. Activity  Rest your knee.  Do not do things that cause pain or make pain worse.  Avoid high-impact activities or exercises, such as running, jumping rope, or doing jumping jacks.  Work with a physical therapist to make a safe exercise program, as recommended by your health care provider. Do exercises as told by your physical therapist. Managing pain, stiffness, and swelling  If directed, put ice on the knee: ? Put ice in a plastic bag. ? Place a towel between your skin and the bag. ? Leave the ice on for 20 minutes, 2-3 times a day.  If directed, use an elastic bandage  to put pressure (compression) on your injured knee. This may control swelling, give support, and help with discomfort. General instructions  Take over-the-counter and prescription medicines only as told by your health care provider.  Raise (elevate) your knee above the level of your heart when you are sitting or lying down.  Sleep with a pillow under your knee.  Do not use any products that contain nicotine or tobacco, such as cigarettes, e-cigarettes, and chewing tobacco. These can delay healing. If you need help quitting, ask your health care provider.  If you are overweight, work with your health care provider and a dietitian to set a weight-loss goal that is healthy and reasonable for you. Extra weight can put pressure on your knee.  Keep all follow-up visits as told by your health care provider. This is important. Contact a health care provider if:  Your knee pain continues, changes, or gets worse.  You have a fever along with knee pain.  Your knee feels warm to the touch.  Your knee buckles or locks up. Get help right away if:  Your knee swells, and the swelling becomes worse.  You cannot move your knee.  You have severe pain in your knee. Summary  Acute knee pain can be caused by a fall, an injury, an infection, or damage, swelling, or irritation of the tissues that support your knee.  Your health care provider may perform tests to find out the cause of the pain.  Pay attention to any changes in your symptoms. Relieve your pain with rest, medicines, light activity, and use of ice.  Get help if your pain continues or becomes worse, your knee swells, or you cannot move your knee. This information is not intended to replace advice given to you by your health care provider. Make sure you discuss any questions you have with your health care provider. Document Revised: 05/29/2018 Document Reviewed: 05/29/2018 Elsevier Patient Education  [employer].   If you have  lab work done today you will be contacted with your lab results within the next 2 weeks.  If you have not heard from [place] then please contact us. The fastest way to get your results is to register for My Chart.   IF you received an x-ray today, you will receive an invoice from [facility]. Please contact [facility] at [phone] with questions or concerns regarding your invoice.   IF you received labwork today, you will receive an invoice from [place]. Please contact [facility] at [phone] with questions or concerns regarding your invoice.   Our billing staff will not be able to assist you with questions regarding bills from these companies.  You will be contacted with the lab results as soon as they are available. The fastest way to get your results is to activate your My Chart account. Instructions are located on the last page of this paperwork. If you have not heard from [place] regarding the results in 2 weeks, please contact this office.         Signed, [name], MD [facility]

## 2020-01-12 LAB — BASIC METABOLIC PANEL
BUN/Creatinine Ratio: 29 — ABNORMAL HIGH (ref 9–20)
BUN: 26 mg/dL — ABNORMAL HIGH (ref 6–24)
CO2: 24 mmol/L (ref 20–29)
Calcium: 8.9 mg/dL (ref 8.7–10.2)
Chloride: 104 mmol/L (ref 96–106)
Creatinine, Ser: 0.89 mg/dL (ref 0.76–1.27)
GFR calc Af Amer: 112 mL/min/{1.73_m2} (ref >59)
GFR calc non Af Amer: 97 mL/min/{1.73_m2} (ref >59)
Glucose: 89 mg/dL (ref 65–99)
Potassium: 3.7 mmol/L (ref 3.5–5.2)
Sodium: 142 mmol/L (ref 134–144)

## 2020-01-14 ENCOUNTER — Other Ambulatory Visit: Payer: Self-pay

## 2020-01-14 ENCOUNTER — Other Ambulatory Visit (INDEPENDENT_AMBULATORY_CARE_PROVIDER_SITE_OTHER): Payer: 59

## 2020-01-14 DIAGNOSIS — E890 Postprocedural hypothyroidism: Secondary | ICD-10-CM | POA: Diagnosis not present

## 2020-01-14 LAB — T4, FREE: Free T4: 1.69 ng/dL — ABNORMAL HIGH (ref 0.60–1.60)

## 2020-01-14 LAB — T3, FREE: T3, Free: 3.3 pg/mL (ref 2.3–4.2)

## 2020-01-15 ENCOUNTER — Other Ambulatory Visit: Payer: Self-pay | Admitting: Endocrinology

## 2020-01-15 DIAGNOSIS — E890 Postprocedural hypothyroidism: Secondary | ICD-10-CM

## 2020-01-15 LAB — TSH: TSH: 0.87 u[IU]/mL (ref 0.35–4.50)

## 2020-01-20 NOTE — Progress Notes (Signed)
Patient ID: Cameron Perez, male   DOB: 06-23-1966, 54 y.o.   MRN: [id number]  I connected with the above-named patient by video enabled telemedicine application and verified that I am speaking with the correct person. The patient was explained the limitations of evaluation and management by telemedicine and the availability of in person appointments.  Patient also understood that there may be a patient responsible charge related to this service . Location of the patient: Patient's home . Location of the provider: Physician office Only the patient and myself were participating in the encounter The patient understood the above statements and agreed to proceed.  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 - FOCAL EXTRATHYROID EXTENSION.  Margins: Free of tumor.  Lymph - Vascular invasion: Present. 1 positive lymph node  TREATMENT: He had 76 mCi of I-131 for remnant ablation on 03/29/14 His post therapy body scan showed 3 foci of uptake in the neck with possible uptake in one lymph node  Monitoring:   Follow-up whole-body scan in 01/2018 did not show any abnormal activity Thyroid ultrasound in 08/2018 does not show any signs of tumor recurrence or lymphadenopathy and stable changes  His thyroglobulin level in 9/19 was relatively high at 4.6 and TSH was 4.4 at that time  His thyroglobulin is now down to 3.1  HYPOTHYROIDISM  TSH levels have been fairly consistent  He is taking 150 mcg, 7-1/2 tablets/week and has not needed a dosage change in some time  He feels fairly good with his energy level, no shakiness or palpitations No change in his weight since his last visit  He has been regular with levothyroxine   regularly in the morning about half hour before breakfast without any milk or coffee at the same time He does take a multivitamin but separately  TSH is now 0.9 compared to 1.3   Wt Readings from Last 3 Encounters:  01/11/20 187 lb 6.4 oz (85 kg)  07/17/19 179 lb 6.4 oz (81.4 kg)  04/01/19 170 lb (77.1 kg)    Lab Results  Component Value Date   TSH 0.87 01/15/2020   TSH 1.30 07/10/2019   TSH 1.170 03/26/2019   FREET4 1.69 (H) 01/14/2020   FREET4 1.12 07/10/2019   FREET4 1.36 01/09/2019     Lab Results  Component Value Date   THYROGLB 3.1 07/10/2019   THYROGLB 4.6 09/12/2018   THYROGLB 3.5 01/07/2018   THYROGLB 1.9 01/08/2017      Allergies as of 01/21/2020   No Known Allergies     Medication List       Accurate as of January 20, 2020  9:20 PM. If you have any questions, ask your nurse or doctor.        amLODipine 5 MG tablet Commonly known as: NORVASC Take 1 tablet (5 mg total) by mouth daily.   Cialis 20 MG tablet Generic drug: tadalafil   clomiPHENE 50 MG tablet Commonly known as: CLOMID Take 50 mg by mouth daily. TAKE 1 TABLET BY MOUTH ONCE DAILY.   levothyroxine 150 MCG tablet Commonly known as: SYNTHROID TAKE 1 TABLET BY MOUTH  DAILY BEFORE BREAKFAST   meloxicam 7.5 MG tablet Commonly known as: MOBIC Take 1 tablet (7.5 mg total) by mouth daily  as needed for pain.       Allergies: No Known Allergies  Past Medical History:  Diagnosis Date  . History of kidney stones 2012  . Kidney stones   . Thyroid cancer ([place]) 2015  . Thyroid disease     Past Surgical History:  Procedure Laterality Date  . LITHOTRIPSY  2011  . THYROIDECTOMY N/A 02/09/2014   Procedure: THYROIDECTOMY;  Surgeon: [name], MD;  Location: [facility];  Service: General;  Laterality: N/A;  . TIBIA FRACTURE SURGERY Left    leg  . VASECTOMY      Family History  Problem Relation Age of Onset  . Breast cancer Mother   . Cancer Mother        Breast  .  Hypertension Mother   . Breast cancer Maternal Grandmother   . Cancer Maternal Grandmother   . Diabetes Paternal Grandfather   . Hyperlipidemia Paternal Grandfather   . Colon cancer Neg Hx     Social History:  reports that he quit smoking about 16 years ago. His smoking use included cigarettes. He quit smokeless tobacco use about 13 years ago.  His smokeless tobacco use included chew. He reports current alcohol use of about 1.0 standard drinks of alcohol per week. He reports that he does not use drugs.   Review of Systems:  No new problems Followed by urologist for hypogonadism with clomiphene   EXAM:  There were no vitals taken for this visit.  Exam not done, patient seen virtually    Assessment/Plan:  Hypothyroidism:   This is well controlled consistently with levothyroxine 150 mcg, 7-1/2 tablets a week He has been subjectively feeling well TSH is again normal around 0.9 He has been able to take his supplement very regularly Free T4 is high and may be related to biotin in his multivitamin and he will need to leave it off for 2 weeks before his next visit  He will stay on the same dose and follow-up in 6 months  Thyroid cancer: History of 1.7 cm papillary thyroid carcinoma with extrathyroidal extension and negative margins Surgery in 2015 No recurrence with various studies  Will recheck his thyroglobulin on his next visit again    [name] 01/20/2020

## 2020-01-21 ENCOUNTER — Ambulatory Visit (INDEPENDENT_AMBULATORY_CARE_PROVIDER_SITE_OTHER): Payer: 59 | Admitting: Endocrinology

## 2020-01-21 ENCOUNTER — Encounter: Payer: Self-pay | Admitting: Endocrinology

## 2020-01-21 ENCOUNTER — Other Ambulatory Visit: Payer: Self-pay

## 2020-01-21 DIAGNOSIS — C73 Malignant neoplasm of thyroid gland: Secondary | ICD-10-CM | POA: Diagnosis not present

## 2020-01-21 DIAGNOSIS — E890 Postprocedural hypothyroidism: Secondary | ICD-10-CM | POA: Diagnosis not present

## 2020-03-02 ENCOUNTER — Encounter: Payer: Self-pay | Admitting: Family Medicine

## 2020-03-26 ENCOUNTER — Other Ambulatory Visit: Payer: Self-pay | Admitting: Endocrinology

## 2020-04-07 ENCOUNTER — Ambulatory Visit: Payer: 59 | Attending: Internal Medicine

## 2020-04-07 DIAGNOSIS — Z23 Encounter for immunization: Secondary | ICD-10-CM

## 2020-04-07 NOTE — Progress Notes (Signed)
   Covid-19 Vaccination Clinic  Name:  TAJA GABEL    MRN: [id number] DOB: 1966/10/07  04/07/2020  Mr. Wolffe was observed post Covid-19 immunization for 15 minutes without incident. He was provided with Vaccine Information Sheet and instruction to access the V-Safe system.   Mr. Conly was instructed to call 911 with any severe reactions post vaccine: Marland Kitchen Difficulty breathing  . Swelling of face and throat  . A fast heartbeat  . A bad rash all over body  . Dizziness and weakness   Immunizations Administered    Name Date Dose VIS Date [place] COVID-19 Vaccine 04/07/2020  2:05 PM 0.3 mL 12/11/2019 Intramuscular   Manufacturer: [place]   Lot: [id number]   [place]: [id number]

## 2020-05-02 ENCOUNTER — Ambulatory Visit: Payer: 59 | Attending: Internal Medicine

## 2020-05-02 DIAGNOSIS — Z23 Encounter for immunization: Secondary | ICD-10-CM

## 2020-05-02 NOTE — Progress Notes (Signed)
   Covid-19 Vaccination Clinic  Name:  Cameron Perez    MRN: [id number] DOB: 1966/11/16  05/02/2020  [name] was observed post Covid-19 immunization for 15 minutes without incident. He was provided with Vaccine Information Sheet and instruction to access the V-Safe system.   [name] was instructed to call 911 with any severe reactions post vaccine: Marland Kitchen Difficulty breathing  . Swelling of face and throat  . A fast heartbeat  . A bad rash all over body  . Dizziness and weakness   Immunizations Administered    Name Date Dose VIS Date Route   Pfizer COVID-19 Vaccine 05/02/2020  1:58 PM 0.3 mL 02/24/2019 Intramuscular   Manufacturer: [place]   Lot: [id number]   [place]: [id number]

## 2020-06-01 IMAGING — DX LEFT KNEE - COMPLETE 4+ VIEW
4 series · 4 of 4 positions shown · non-contrast
Comparison: None.

CLINICAL DATA: Left knee prepatellar swelling.

EXAM:
LEFT KNEE - COMPLETE 4+ VIEW

[knee ap]
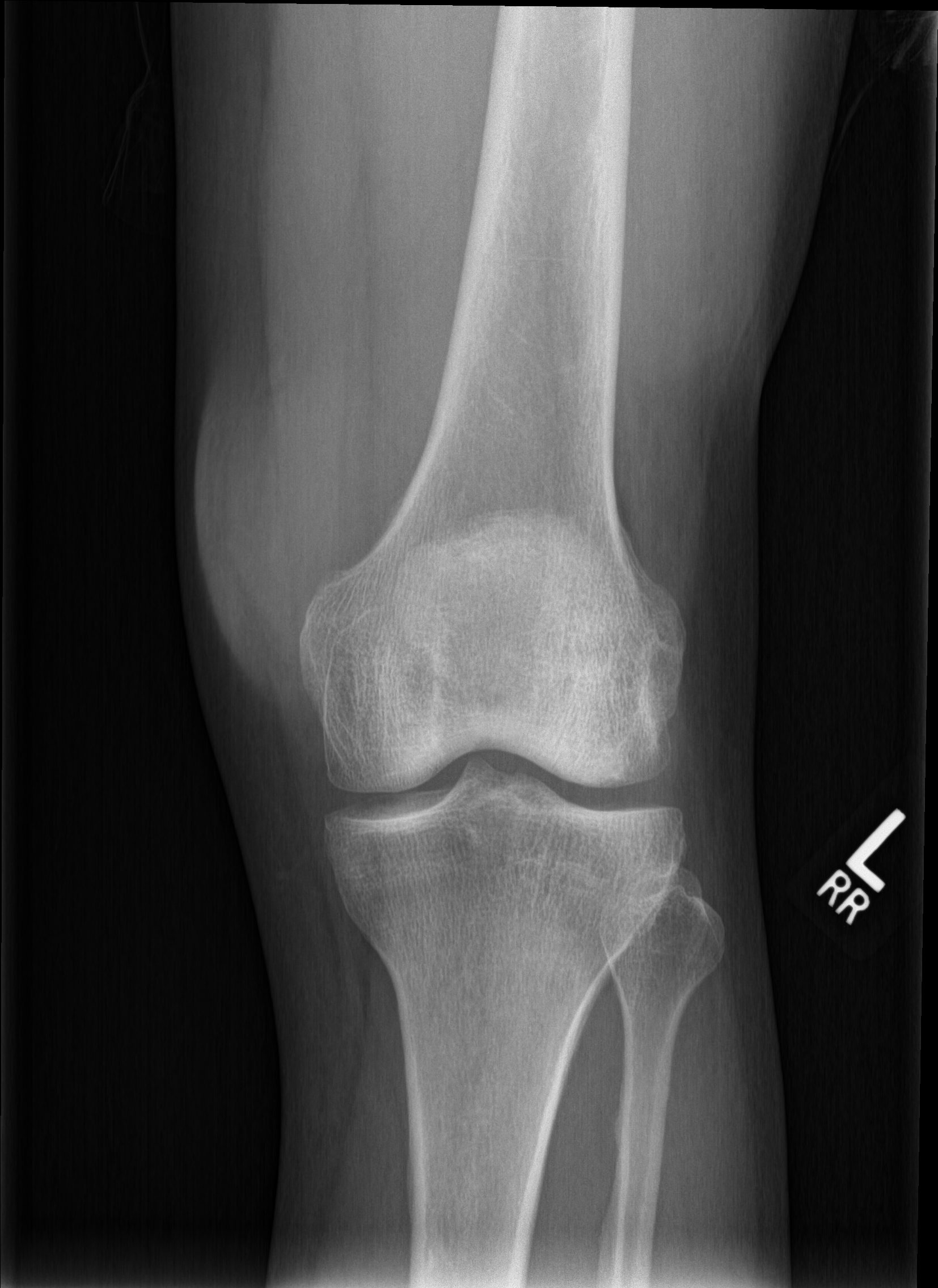

[knee obl (1 of 2)]
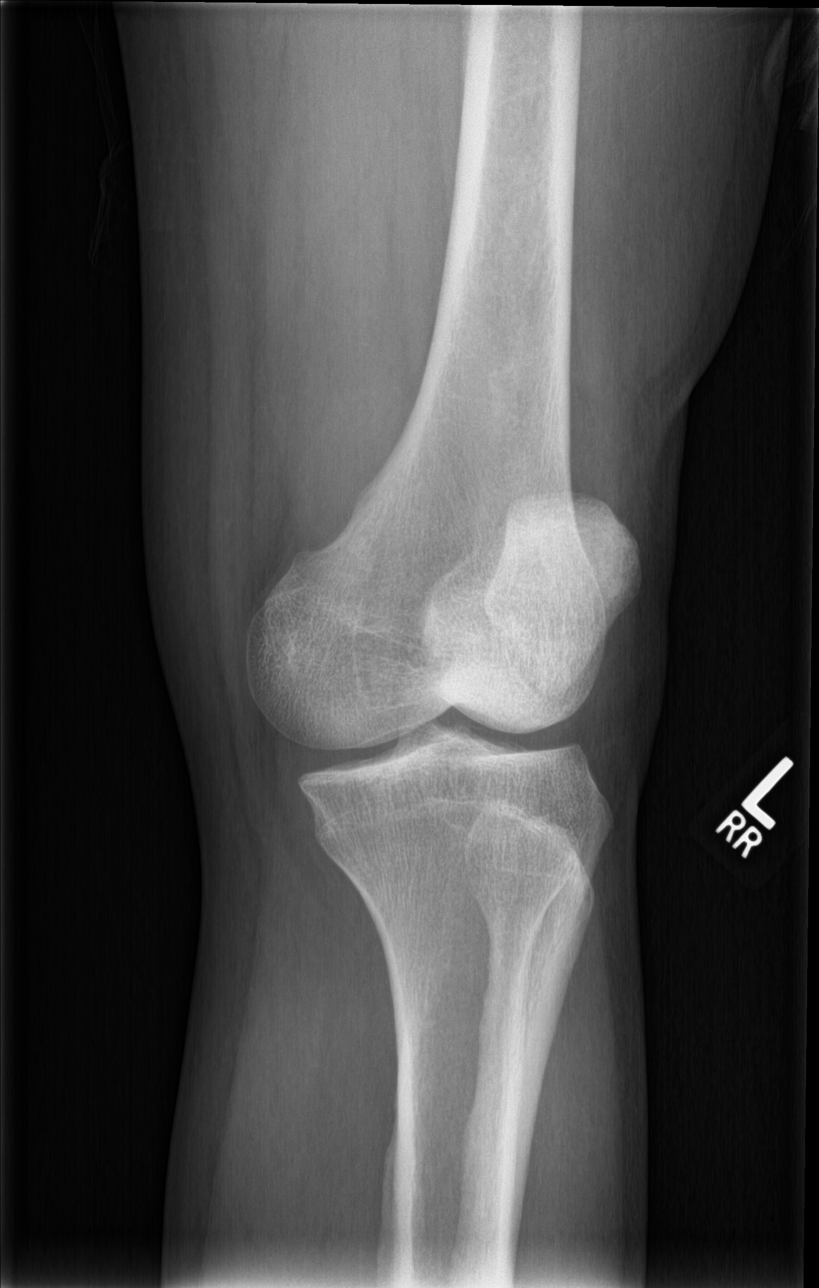

[knee obl (2 of 2)]
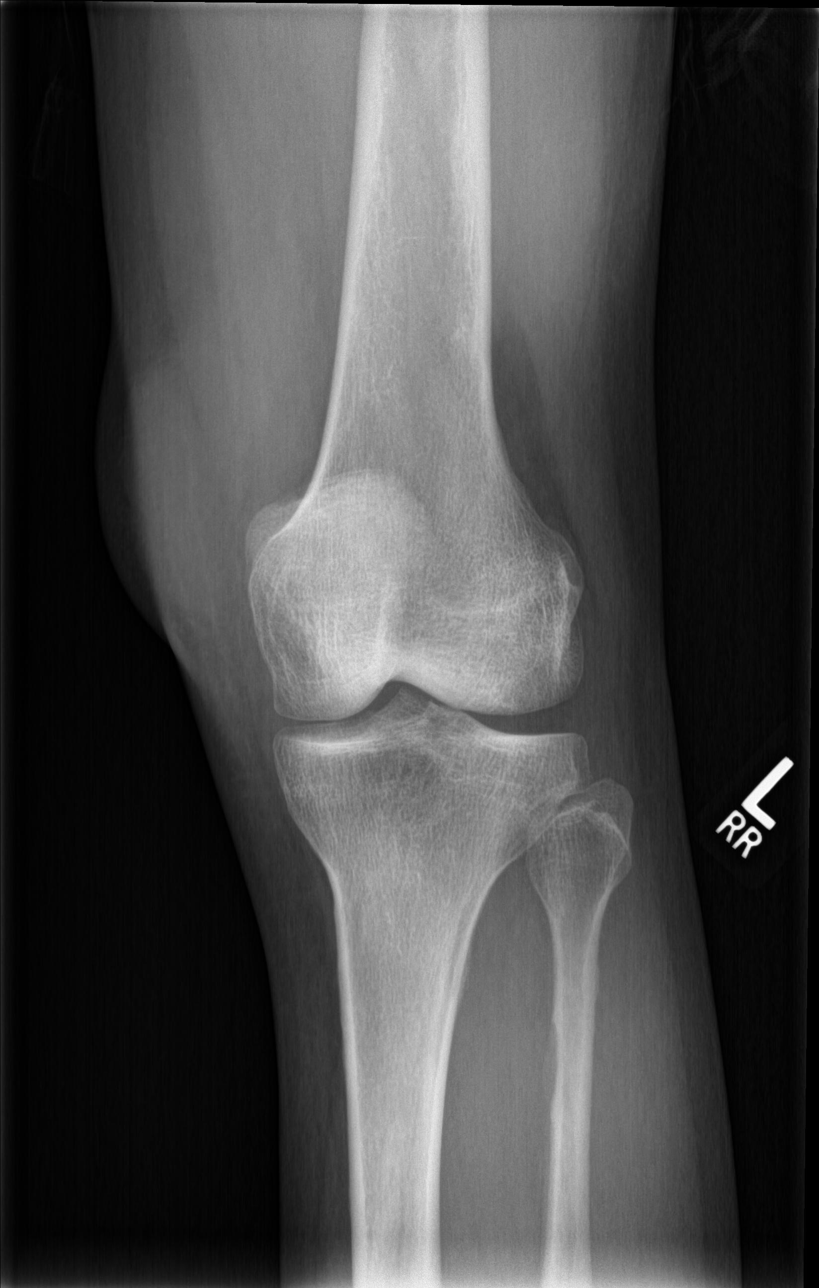

[knee lat]
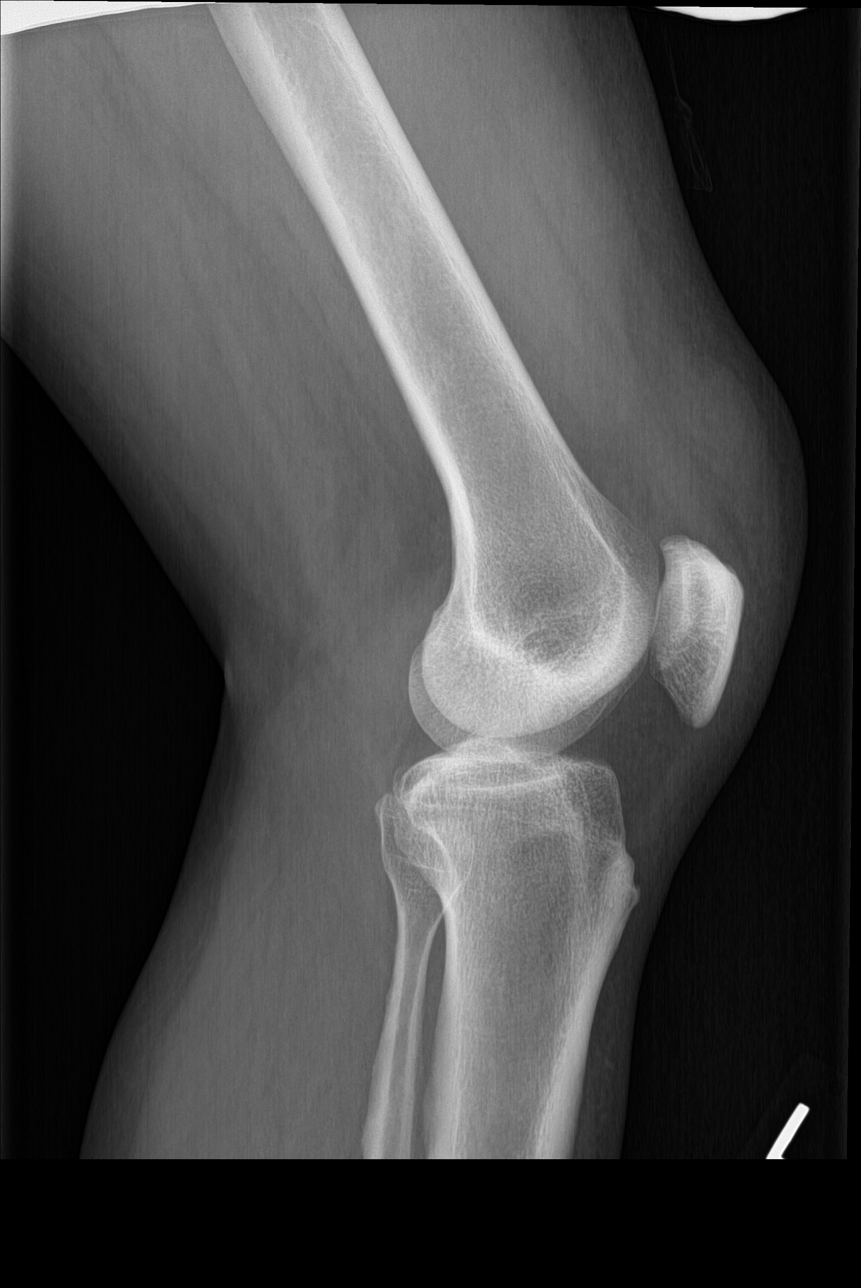

[4 of 4 positions shown; findings below may reference images not displayed]

FINDINGS: There is soft tissue swelling overlying the medial and anterior
aspect of the distal femur. No joint effusion identified. No
fracture or dislocation.
IMPRESSION: 1. Anterior and medial soft tissue swelling overlying the distal
femur.

## 2020-11-08 ENCOUNTER — Other Ambulatory Visit: Payer: Self-pay | Admitting: Family Medicine

## 2020-11-08 DIAGNOSIS — I10 Essential (primary) hypertension: Secondary | ICD-10-CM

## 2020-11-08 NOTE — Telephone Encounter (Signed)
Requested medications are due for refill today?  Yes  Requested medications are on active medication list?  Yes  Last Refill:  01/11/2020  # 90 with 3 refills   Future visit scheduled?  No   Notes to Clinic:  Medication failed Rx refill protocol due to no valid encounter in the past 6 months.  Last visit was 10 months ago.

## 2020-11-22 ENCOUNTER — Other Ambulatory Visit: Payer: Self-pay | Admitting: Urology

## 2020-11-22 DIAGNOSIS — Z23 Encounter for immunization: Secondary | ICD-10-CM

## 2020-11-27 ENCOUNTER — Other Ambulatory Visit: Payer: Self-pay | Admitting: Endocrinology

## 2020-11-28 ENCOUNTER — Other Ambulatory Visit: Payer: Self-pay | Admitting: *Deleted

## 2020-11-28 MED ORDER — LEVOTHYROXINE SODIUM 150 MCG PO TABS
ORAL_TABLET | ORAL | 0 refills | Status: DC
Start: 2020-11-28 — End: 2021-02-04

## 2020-11-28 MED ORDER — LEVOTHYROXINE SODIUM 150 MCG PO TABS
ORAL_TABLET | ORAL | 0 refills | Status: DC
Start: 2020-11-28 — End: 2020-11-28

## 2020-12-24 ENCOUNTER — Other Ambulatory Visit: Payer: Self-pay | Admitting: Family Medicine

## 2020-12-24 DIAGNOSIS — I10 Essential (primary) hypertension: Secondary | ICD-10-CM

## 2020-12-25 NOTE — Telephone Encounter (Signed)
Requested medication (s) are due for refill today: yes  Requested medication (s) are on the active medication list: yes  Last refill:  11/09/20  Future visit scheduled: no  Notes to clinic:  needs appt   Requested Prescriptions  Pending Prescriptions Disp Refills   amLODipine (NORVASC) 5 MG tablet [Pharmacy Med Name: amLODIPine Besylate 5 MG Oral Tablet] 30 tablet 11    Sig: TAKE 1 TABLET BY MOUTH  DAILY      Cardiovascular:  Calcium Channel Blockers Failed - 12/24/2020  9:50 PM      Failed - Valid encounter within last 6 months    Recent Outpatient Visits           11 months ago Essential hypertension   [facility], Ranell Patrick, MD   1 year ago Essential hypertension   [facility], Ranell Patrick, MD   1 year ago Knee swelling   [facility], Ranell Patrick, MD   1 year ago Pain and swelling of left knee   [facility], Ranell Patrick, MD   2 years ago Sprain of anterior talofibular ligament of right ankle, subsequent encounter   [facility], MD                Passed - Last BP in normal range    BP Readings from Last 1 Encounters:  01/11/20 127/86

## 2020-12-26 NOTE — Telephone Encounter (Signed)
Patient needs a f/u appt no refills at this time. Patient was already given a 30 day supply and appt was not made. Once appt made we can give 30 day

## 2020-12-26 NOTE — Telephone Encounter (Signed)
Called patient to schedule appointment. Patient out of town and will call to schedule when he returns. Patient aware he will not receive a refill until he comes in for an appointment.

## 2021-02-03 ENCOUNTER — Other Ambulatory Visit: Payer: Self-pay | Admitting: Endocrinology

## 2021-06-15 ENCOUNTER — Telehealth: Payer: Self-pay | Admitting: Endocrinology

## 2021-06-15 NOTE — Telephone Encounter (Signed)
-----   Message from [name], MD sent at 05/22/2021  3:18 PM EDT ----- Regarding: No follow-up made Patient overdue for appointment.  Please call to schedule with labs before appointment

## 2021-06-15 NOTE — Telephone Encounter (Signed)
LMTCB to schedule labs & visit with Dr Dwyane Dee

## 2021-12-10 ENCOUNTER — Other Ambulatory Visit: Payer: Self-pay | Admitting: Urology

## 2021-12-10 DIAGNOSIS — Z23 Encounter for immunization: Secondary | ICD-10-CM

## 2021-12-18 ENCOUNTER — Other Ambulatory Visit: Payer: Self-pay | Admitting: Endocrinology

## 2025-02-04 ENCOUNTER — Telehealth: Payer: Self-pay

## 2025-02-04 NOTE — Telephone Encounter (Signed)
 Attempted to reach patient concerning colonoscopy recall; unable to speak with patient;  left message and number to the office for patient to call back and schedule appts;
